# Patient Record
Sex: Female | Born: 1945
Health system: Southern US, Community
[De-identification: ages and names within clinical notes are randomized; demographics above are authoritative.]

## PROBLEM LIST (undated history)

## (undated) DIAGNOSIS — E785 Hyperlipidemia, unspecified: Secondary | ICD-10-CM

## (undated) DIAGNOSIS — H40009 Preglaucoma, unspecified, unspecified eye: Secondary | ICD-10-CM

## (undated) DIAGNOSIS — I10 Essential (primary) hypertension: Secondary | ICD-10-CM

## (undated) DIAGNOSIS — H579 Unspecified disorder of eye and adnexa: Secondary | ICD-10-CM

## (undated) HISTORY — DX: Unspecified disorder of eye and adnexa: H57.9

## (undated) HISTORY — DX: Hyperlipidemia, unspecified: E78.5

## (undated) HISTORY — PX: BUNIONECTOMY: SHX129

## (undated) HISTORY — DX: Preglaucoma, unspecified, unspecified eye: H40.009

## (undated) HISTORY — DX: Essential (primary) hypertension: I10

---

## 1997-11-17 ENCOUNTER — Encounter: Admission: RE | Admit: 1997-11-17 | Discharge: 1997-11-17 | Payer: Self-pay | Admitting: Family Medicine

## 1998-01-27 HISTORY — PX: OTHER SURGICAL HISTORY: SHX169

## 1998-01-27 LAB — HM COLONOSCOPY: HM Colonoscopy: NORMAL

## 1998-08-24 ENCOUNTER — Encounter: Admission: RE | Admit: 1998-08-24 | Discharge: 1998-08-24 | Payer: Self-pay | Admitting: Family Medicine

## 1998-12-05 ENCOUNTER — Encounter: Admission: RE | Admit: 1998-12-05 | Discharge: 1998-12-05 | Payer: Self-pay | Admitting: Family Medicine

## 1999-04-05 ENCOUNTER — Encounter: Admission: RE | Admit: 1999-04-05 | Discharge: 1999-04-05 | Payer: Self-pay | Admitting: Family Medicine

## 1999-04-16 ENCOUNTER — Encounter: Admission: RE | Admit: 1999-04-16 | Discharge: 1999-04-16 | Payer: Self-pay | Admitting: Family Medicine

## 1999-05-15 ENCOUNTER — Encounter: Admission: RE | Admit: 1999-05-15 | Discharge: 1999-05-15 | Payer: Self-pay | Admitting: Family Medicine

## 1999-05-28 ENCOUNTER — Encounter (INDEPENDENT_AMBULATORY_CARE_PROVIDER_SITE_OTHER): Payer: Self-pay | Admitting: *Deleted

## 1999-05-28 LAB — CONVERTED CEMR LAB

## 1999-06-14 ENCOUNTER — Encounter: Admission: RE | Admit: 1999-06-14 | Discharge: 1999-06-14 | Payer: Self-pay | Admitting: Family Medicine

## 1999-06-28 ENCOUNTER — Encounter: Admission: RE | Admit: 1999-06-28 | Discharge: 1999-06-28 | Payer: Self-pay | Admitting: Family Medicine

## 1999-06-28 ENCOUNTER — Encounter: Payer: Self-pay | Admitting: Family Medicine

## 1999-08-01 ENCOUNTER — Ambulatory Visit (HOSPITAL_BASED_OUTPATIENT_CLINIC_OR_DEPARTMENT_OTHER): Admission: RE | Admit: 1999-08-01 | Discharge: 1999-08-01 | Payer: Self-pay | Admitting: Podiatry

## 2000-01-07 ENCOUNTER — Encounter: Admission: RE | Admit: 2000-01-07 | Discharge: 2000-01-07 | Payer: Self-pay | Admitting: Family Medicine

## 2000-11-25 ENCOUNTER — Ambulatory Visit (HOSPITAL_COMMUNITY): Admission: RE | Admit: 2000-11-25 | Discharge: 2000-11-25 | Payer: Self-pay | Admitting: Family Medicine

## 2000-11-25 ENCOUNTER — Encounter: Admission: RE | Admit: 2000-11-25 | Discharge: 2000-11-25 | Payer: Self-pay | Admitting: Family Medicine

## 2000-12-17 ENCOUNTER — Ambulatory Visit (HOSPITAL_BASED_OUTPATIENT_CLINIC_OR_DEPARTMENT_OTHER): Admission: RE | Admit: 2000-12-17 | Discharge: 2000-12-17 | Payer: Self-pay | Admitting: Podiatry

## 2003-01-28 HISTORY — PX: OTHER SURGICAL HISTORY: SHX169

## 2003-09-22 ENCOUNTER — Ambulatory Visit (HOSPITAL_COMMUNITY): Admission: RE | Admit: 2003-09-22 | Discharge: 2003-09-23 | Payer: Self-pay | Admitting: Ophthalmology

## 2004-03-08 ENCOUNTER — Ambulatory Visit: Payer: Self-pay | Admitting: Sports Medicine

## 2006-03-26 DIAGNOSIS — I10 Essential (primary) hypertension: Secondary | ICD-10-CM | POA: Insufficient documentation

## 2006-03-26 DIAGNOSIS — D259 Leiomyoma of uterus, unspecified: Secondary | ICD-10-CM | POA: Insufficient documentation

## 2006-03-26 DIAGNOSIS — E785 Hyperlipidemia, unspecified: Secondary | ICD-10-CM | POA: Insufficient documentation

## 2006-03-26 DIAGNOSIS — K219 Gastro-esophageal reflux disease without esophagitis: Secondary | ICD-10-CM

## 2006-03-26 DIAGNOSIS — N951 Menopausal and female climacteric states: Secondary | ICD-10-CM | POA: Insufficient documentation

## 2006-03-27 ENCOUNTER — Encounter (INDEPENDENT_AMBULATORY_CARE_PROVIDER_SITE_OTHER): Payer: Self-pay | Admitting: *Deleted

## 2006-09-08 ENCOUNTER — Emergency Department (HOSPITAL_COMMUNITY): Admission: EM | Admit: 2006-09-08 | Discharge: 2006-09-08 | Payer: Self-pay | Admitting: Emergency Medicine

## 2006-09-15 ENCOUNTER — Ambulatory Visit: Payer: Self-pay | Admitting: Family Medicine

## 2006-09-15 DIAGNOSIS — R1011 Right upper quadrant pain: Secondary | ICD-10-CM

## 2006-09-16 ENCOUNTER — Ambulatory Visit (HOSPITAL_COMMUNITY): Admission: RE | Admit: 2006-09-16 | Discharge: 2006-09-16 | Payer: Self-pay | Admitting: Family Medicine

## 2009-02-07 IMAGING — US US ABDOMEN COMPLETE
1 series · 13 of 25 positions shown · non-contrast
Comparison: Chest radiograph of 09/08/06.  No prior abdominal imaging for comparison.

CLINICAL DATA: Right upper quadrant pain.  
ABDOMEN ULTRASOUND:
TECHNIQUE: Complete abdominal ultrasound examination was performed including evaluation of the liver, gallbladder, bile ducts, pancreas, kidneys, spleen, IVC, and abdominal aorta.

[Series 1: us abdomen complete · 0.33mm/px · 13 of 61 slices shown]
[im 1/61]
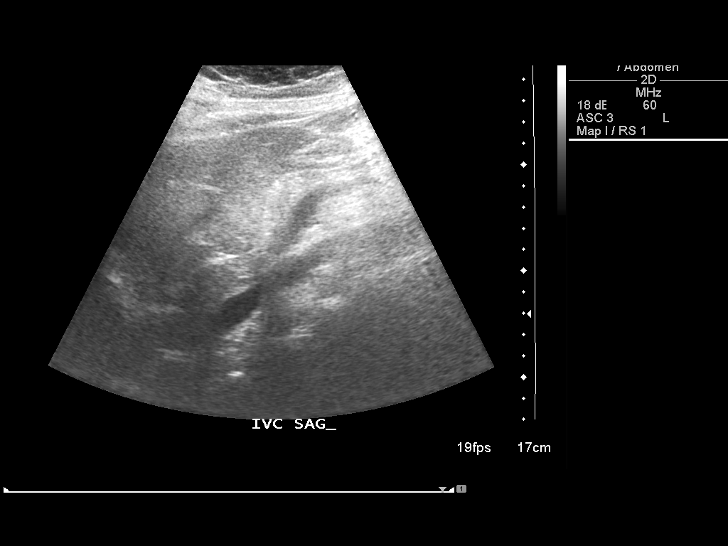
[im 6/61]
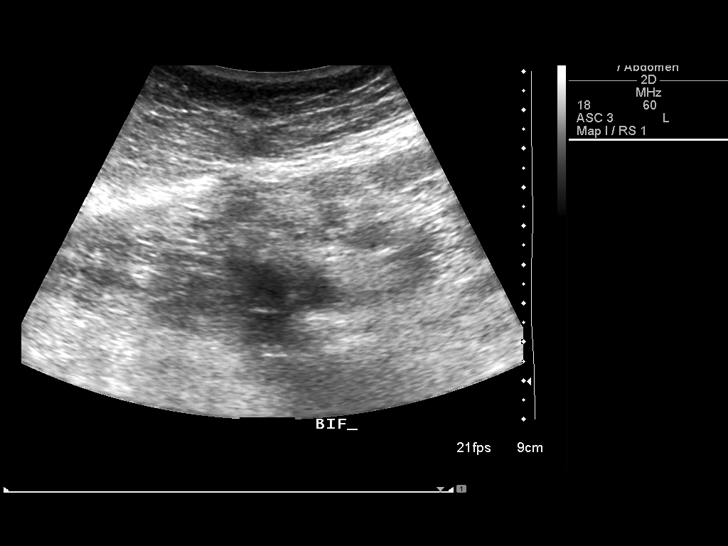
[im 11/61]
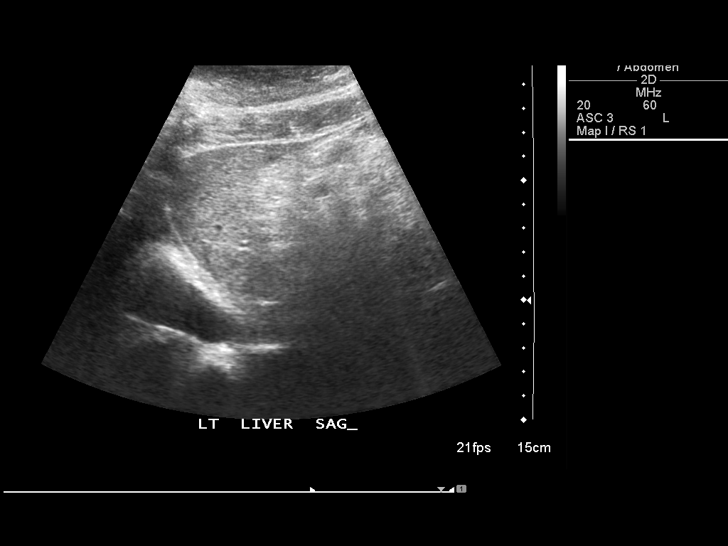
[im 16/61]
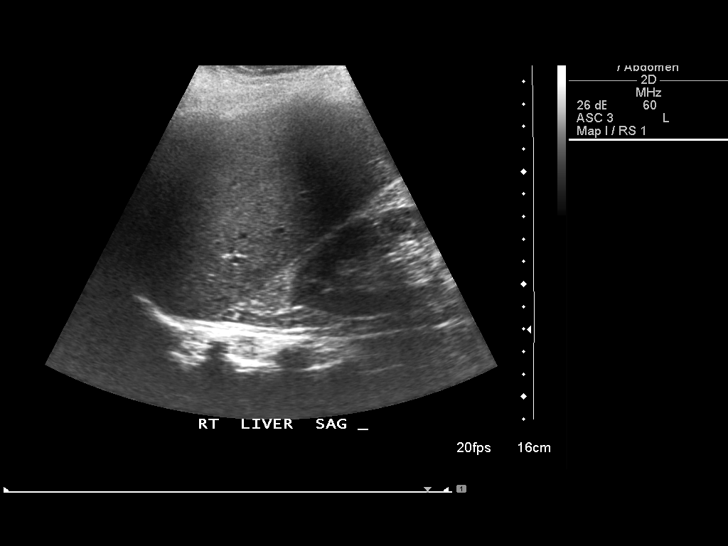
[im 21/61]
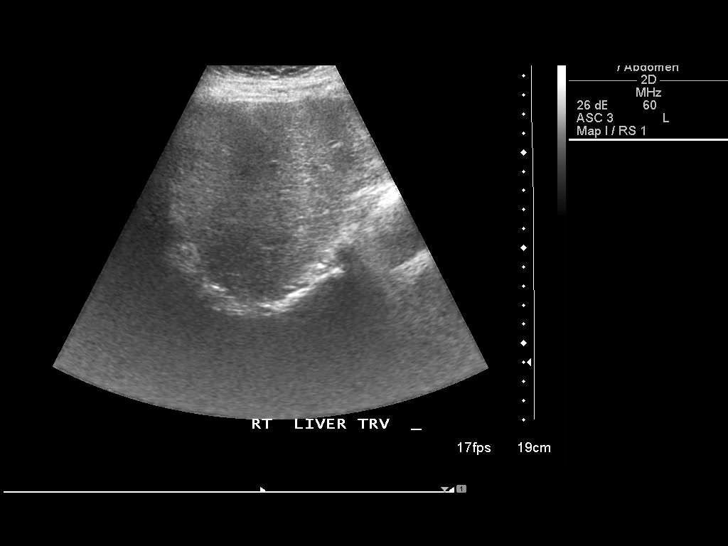
[im 26/61]
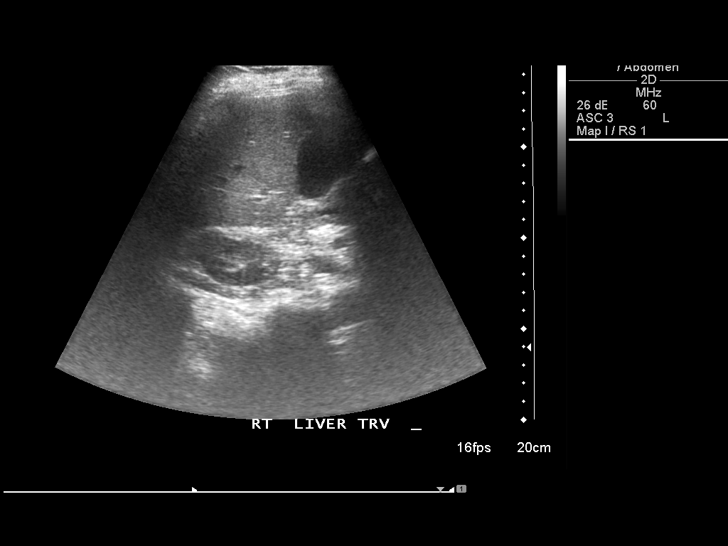
[im 31/61]
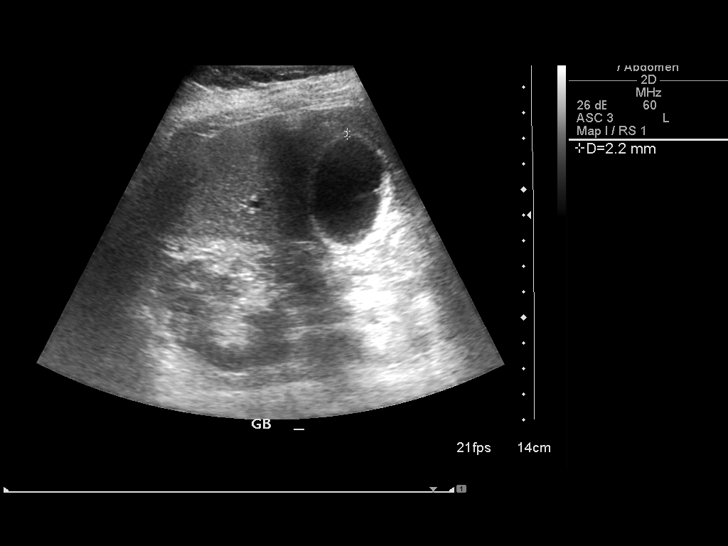
[im 36/61]
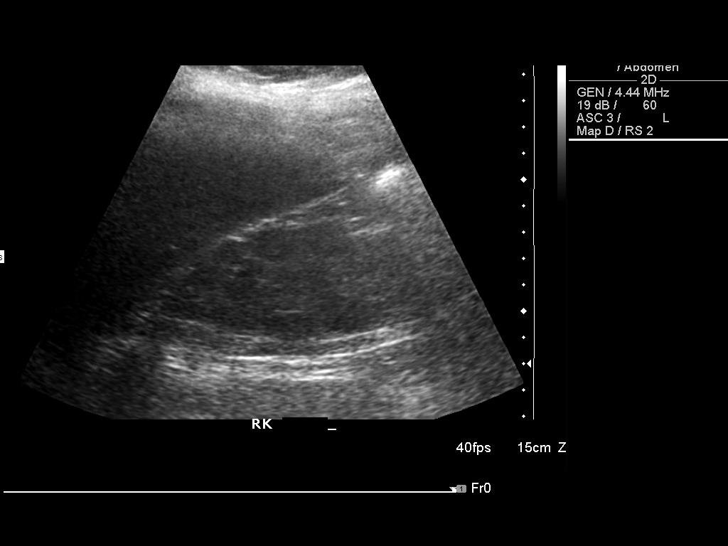
[im 41/61]
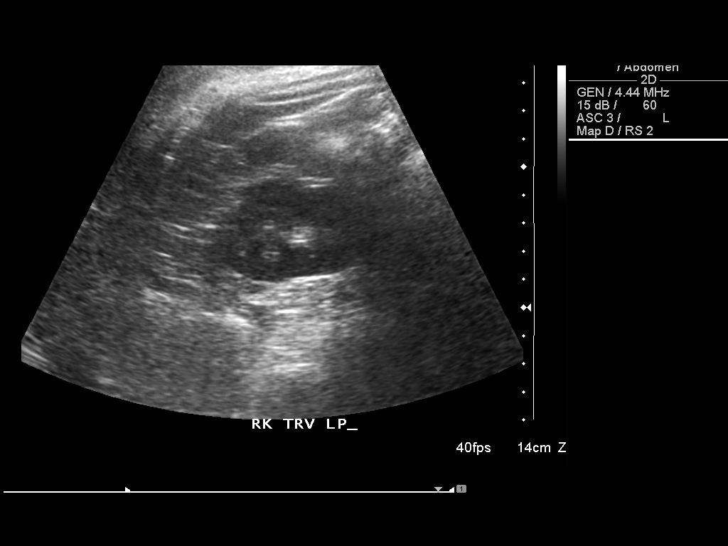
[im 46/61]
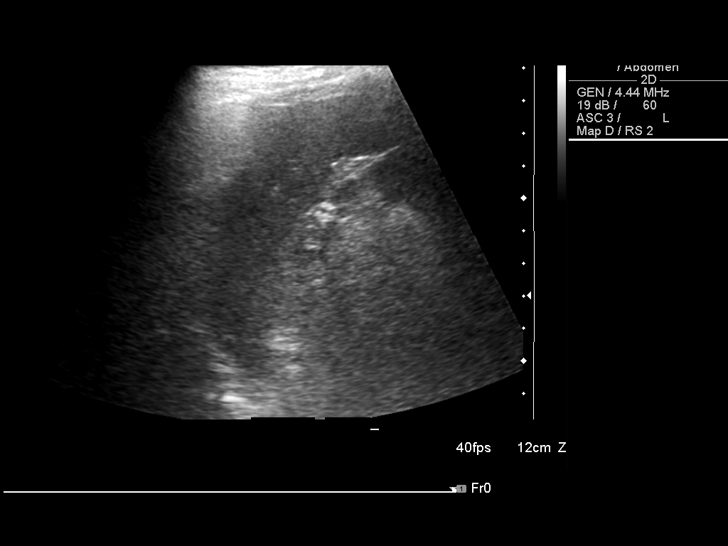
[im 51/61]
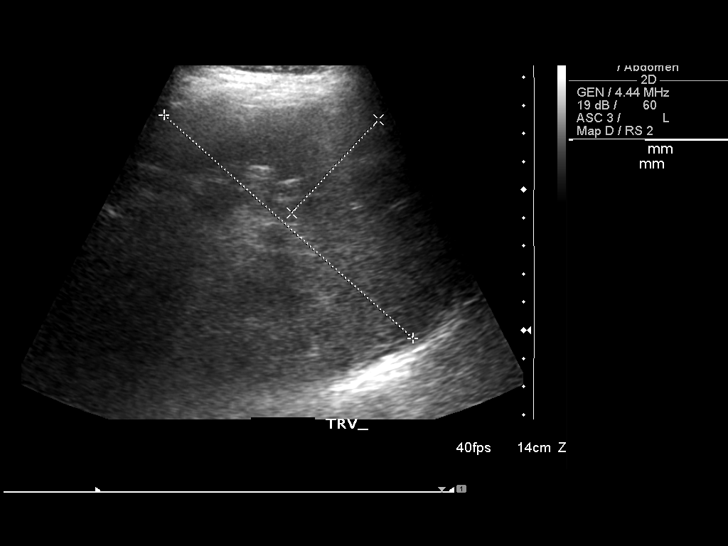
[im 56/61]
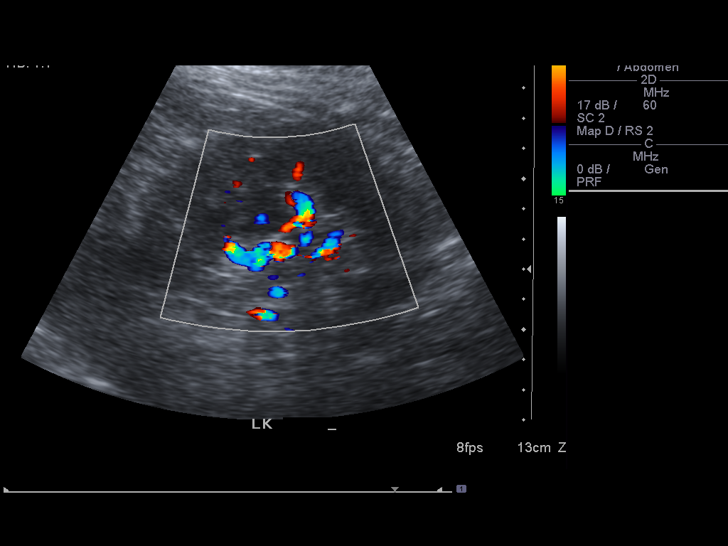
[im 61/61]
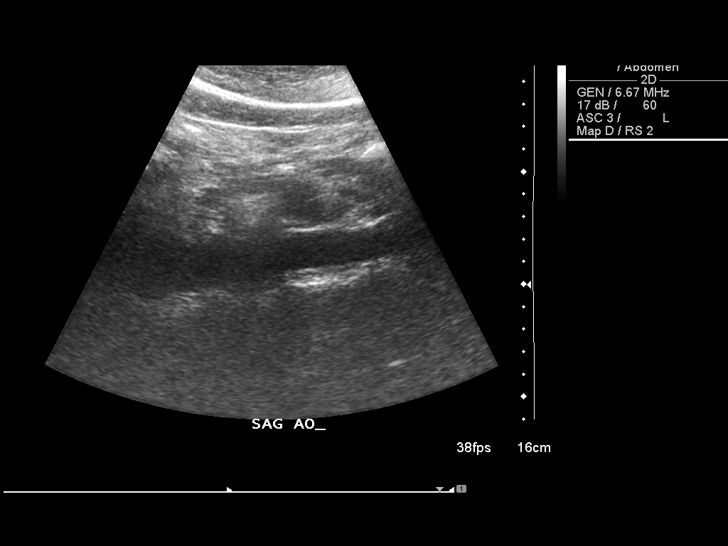

[13 of 25 positions shown; findings below may reference images not displayed]

FINDINGS: A 2 mm non-shadowing echogenic focus is seen along the gallbladder wall and appears immobile.  The remainder of the gallbladder is normal.  The gallbladder wall thickness is normal.  Negative for pericholecystic fluid.  The sonographic Murphy?s sign is negative.  The common bile duct is 5.7 mm in diameter.  The liver is normal in echogenicity without evidence of focal mass or biliary dilatation. 
The visualized portions of the pancreas are unremarkable.  The pancreatic tail is obscured by bowel gas.
The spleen is normal in size and echotexture.  
The kidneys are normal in size measuring 12.3 cm on the right and 11.0 cm on the left.  Negative for hydronephrosis.  
The maximal caliber of the abdominal aorta is 2.6 cm in AP dimension.
IMPRESSION: 1. 2 mm non-shadowing focus along the gallbladder wall is favored to represent a small cholesterol polyp or less likely an adherent gallstone.  Otherwise, the gallbladder is negative, and the patient?s right upper quadrant pain is not felt to be related to this small gallbladder polyp versus adherent stone.
2. Otherwise unremarkable abdominal ultrasound.

## 2010-06-14 NOTE — Op Note (Signed)
[place]. [facility]  Patient:    Jade Gross, BREIT Visit Number: [id number] MRN: [id number]          Service Type: DSU Location: [facility] Attending Physician:  Severiano Gilbert Dictated by:   Eugenio Hoes Petrinitz, D.P.M. Proc. Date: 12/17/00 Admit Date:  12/17/2000                             Operative Report  PREOPERATIVE DIAGNOSIS:  Hallux abductovalgus deformity, left foot.  POSTOPERATIVE DIAGNOSIS:  Hallux abductovalgus deformity, left foot.  PROCEDURE PERFORMED:  Austin bunionectomy with K-wire pin fixation.  ANESTHESIA:  IV sedation with local.  HEMOSTASIS:  By ankle tourniquet and Bovie.  Minimal blood loss.  DESCRIPTION OF PROCEDURE:  The patient was brought to the OR and placed on the table in the supine position.  The ankle tourniquet was placed.  Superior malleoli left foot with sufficient padding to prevent contusion.  Upon achieving of sedation, local anesthesia was administered.  The left foot was prepped and draped in the usual aseptic manner.  The left foot was exsanguinated with an Esmarch bandage, ankle tourniquet inflated to 250 mmHg. The following procedure was performed.  Austin bunionectomy left foot, K-wire pin fixation.  Attention was directed to the dorsum of the left foot.  A longitudinal incision was planned with marking pen.  This was centered between extensor hallucis longus tendon and medial aspect of the foot.  The skin incision was executed with a 15-blade and sharp and blunt tissue dissection was performed through the superficial and deep fascial layers.  All venous bleeders were clamped and bovied as encountered.  The capsule overlying the first MPJ was identified and then incised in a longitudinal fashion.  The capsule and periosteum were reflected free with the [facility]. Further sharp dissection was performed on the medial side exposing the medial prominence.  The joint space was inspected and there  was found to be good cartilaginous surface overlying the metatarsal head and base of the proximal phalanx with no defects or signs of osteoarthritis.  Attention was then directed to the interspace.  Sharp and blunt tissue dissection was performed down to the level of the adductor tendon at the level of the base of the proximal phalanx.  This was then excised in a coaxial fashion.  An incision was performed superior to the fibular sesamoid and all other attachments were left intact.  There was good mobilization of the lateral side.  The interspace was flushed with copious amounts of antibiotic solution.  Attention was redirected to the medial side and the capsule was reflected with the malleable retractor.  The osseous prominence was then resected in a longitudinal fashion with the sagittal saw with great care to preserve the sagittal groove. The V-shaped Austin osteotomy was then planned on the flattened medial surface with apex distal and wings exiting proximally.  This was then executed from mediolateral with the sagittal saw.  The metatarsal head was then transposed laterally in a more correct anatomical position approximately one-third with great care not to put the toe in varus.  The metatarsal head was then impacted.  The forefoot was loaded and there was found to be good rectus position, good alignment of the phalangeal base of the metatarsal head with no risk of varus.  This was then checked with the [facility] mobile x-ray unit and confirmed that there was good alignment with no risk of varus.  The  surgical site was flushed with copious amounts of antibiotic solution and the remaining shelf medial on the proximal side was resected with the sagittal saw.  All rough edges were rasped smooth with a football bur, followed by reciprocating rasp.  Once again, additional antibiotic flush was performed and the site was found to be free of bony spicules.  The joint capsule and periosteum were  then reapproximated with 3-0 Monocryl.  The deep and superficial fascial layers were reapproximated with 4-0 Monocryl and the skin reapproximated with 5-0 Monocryl in a subcuticular fashion.  The site was then injected with 1 cc of Dexamethasone phosphate, 4 mg/mL.  Area was prepped with benzoin, followed by application of Steri-Strips, prepped with Betadine and Xeroform with a dry sterile dressing was applied.  This was further reinforced with Coflex compression and Ace bandage.  The ankle tourniquet was deflated and capillary filling time was noted to return instantaneous in all digits.  The patient tolerated the procedure and anesthesia well and was transferred to recovery room.  All vital signs stable.   Dispensed detailed postoperative instructions, prescription and return to office visit in 4-7 days.  Advise the patient to contact myself or emergency pager service if questions or problems arise. Dictated by:   Eugenio Hoes Petrinitz, D.P.M. Attending Physician:  Severiano Gilbert DD:  12/17/00 TD:  12/17/00 Job: [id number]

## 2010-06-14 NOTE — Op Note (Signed)
NAME:  Jade Gross, Jade Gross                            ACCOUNT NO.:  [id number]   MEDICAL RECORD NO.:  [id number]                   PATIENT TYPE:  OIB   LOCATION:  5742                                 FACILITY:  [facility]   PHYSICIAN:  Beulah Gandy. Ashley Royalty, M.D.              DATE OF BIRTH:  17-Mar-1945   DATE OF PROCEDURE:  09/22/2003  DATE OF DISCHARGE:  09/23/2003                                 OPERATIVE REPORT   ADMISSION DIAGNOSIS:  Rhegmatogenous retinal detachment left eye.   PROCEDURES:  1. Scleral buccal left eye.  2. Gas injection left eye.  3. Panretinal photocoagulation left eye.   SURGEON:  Alan Mulder, M.D.   ASSISTANT:  Alma Downs, P.A.   ANESTHESIA:  General.   DETAILS:  Usual prep and drape, 360 degree limbal peritomy, isolation of  four rectus muscles on 2-0 silk.  Localization of breaks at 8 and 10  o'clock.  Scleral dissection for 360 degrees to admit a #279 intrascleral  implant.  Diathermy placed in the bed.  The 279 implant was placed with the  joint at 2 o'clock.  A 240 band placed around the eye with a 270 sleeve at  10 o'clock.  Perforation site shows it at 9 o'clock in the posterior aspect  of the bed.  A large amount of clear, colorless, subretinal fluid came forth  in a controlled slow manner.  The scleral buccal was closed and a 508G  radial segment was placed beneath the 8 o'clock break.  The scleral flaps  were closed and the proper indentation of the globe was performed.  The band  was adjusted and trimmed, the buccal was adjusted and trimmed.  Indirect  ophthalmoscopy showed the retina to be lying nicely on the scleral buccal  for 360 degrees. The indirect ophthalmoscope laser was moved into place.  There were 1117 burns placed around the retinal periphery with a power  between 400 and 700 milliwatts, 1000 microns each and 0.07 seconds each.  These scleral sutures were drain securely, knotted and the free ends  removed.  The conjunctiva was reposited  with 7-0 chromic suture.  C3F8 100%,  0.2 mL, was injected to the 10 o'clock pars plana.  The conjunctiva was  reposited with 7-0 chromic suture.  Polymyxin and gentamicin were irrigated  into Tenon's space.  Atropine solution was applied.  Marcaine was injected  around the globe for postop pain.  Decadron 10 mg was injected into the  lower subconjunctival space.  Polysporin, a patch and shield were placed.  Closing tension was obtained with a Barraquer tonometer.   COMPLICATIONS:  None.   DURATION:  Two hours.   Polysporin, a patch and shield were placed.  The patient was awakened and  taken to recovery in satisfactory condition.  Beulah Gandy. Ashley Royalty, M.D.   JDM/MEDQ  D:  09/22/2003  T:  09/24/2003  Job:  [id number]

## 2010-06-14 NOTE — Op Note (Signed)
[place]  Patient:    Jade Gross, Jade Gross                         MRN: [id number] Proc. Date: 08/01/99 Adm. Date:  [id number] Attending:  Geradine Girt A                           Operative Report  SURGEON:  Eugenio Hoes. Petrinitz, D.P.M.  CO-SURGEON:  Arbutus Ped, M.D.  PREOPERATIVE DIAGNOSIS:  Hallux rigidus, first metatarsophalangeal joint right foot.  POSTOPERATIVE DIAGNOSIS:  Hallux rigidus, first metatarsophalangeal joint right foot.  PROCEDURE PERFORMED:  Lorenz Coaster bunionectomy right foot, with Bio-Action great toe implant.  ANESTHESIA:  IV sedation with local.  HEMOSTASIS:  Via ankle tourniquet.  PREOPERATIVE PROPHYLAXIS:  With antibiotics 1 g Ancef IV piggyback prior to inflation of tourniquet.  DESCRIPTION OF PROCEDURE:  The patient was brought to the OR and placed on table in a supine position.  An ankle tourniquet was placed superior to malleoli, right foot, with sufficient padding to prevent contusion.  Upon achieving of sedation, the right foot was injected with local anesthetic in a typical Mayo block.  The right foot was prepped and draped in the usual aseptic manner.  Right foot exsanguinated with an Esmarch bandage, ankle tourniquet inflated to 250 mmHg.  Following procedure performed:  Keller bunionectomy, right foot, with total first MPJ Bio-Action joint implant. Attention was drawn to the dorsum of the right foot.  A longitudinal skin incision was planned and performed overlying the first MPJ in a longitudinal fashion.  This was centered between the extensor hallucis longus tendon and the medial aspect of the foot.  Further sharp and blunt tissue dissection was performed.  Venous bleeders were clamped and bovied as encountered.  Further dissection was carried down to the level of the joint capsule overlying the first MPJ.  The joint capsule was incised in a longitudinal fashion.  The periosteum was reflected with a Building control surveyor.  Further sharp dissection was performed and all soft tissue was dissected free from the head of the first metatarsal and base of the proximal phalanx.  Attention was then directed to the metatarsal head, and bone cot in the metatarsal head was planned.  This was perpendicular to the long axis of the metatarsal in the sagittal and transverse planes.  Approximately 2 mm of bone was excised.  This was executed with the sagittal saw.  Attention was then directed to the base of the proximal phalanx.  At this time, the base of the proximal phalanx was excised with the sagittal saw.  This was performed perpendicular to the long axis of the hallux.  The base of the proximal phalanx was sharply dissected free.  The metatarsal component and phalangeal component were held together and measured, and this was approximately 1.2 cm of bone in width.  Attention was then directed to the head of the proximal phalanx.  Copious antibiotic lavage was performed utilizing the lollipop.  The central canal was then reamed with the T-shaped awl.  The metatarsal medullary canal was then drilled with an egg-shaped bur.  Utilizing the proper reaming metatarsal component, the metatarsal canal was prepared for the small metatarsal implant.  After this was complete, the site was flushed with copious amounts of antibiotic solution, followed by placement of the small neutral metatarsal head sizer. This fit very well.  Attention was then directed to  the phalangeal base.  The appropriate broach was utilized, followed by the central placement with the phalangeal lollipop.  The broach was utilized to prepare the canal, and then the appropriate sizer was implanted.  The site was flushed with copious amounts of antibiotic solution.  An [facility] radiograph was then utilized, and there was found to be good placement in both the dorsal plantar and lateral views.  The appropriate implants were then requested, and these  were placed in antibiotic lavage solution.  The sizers were removed, the site was flushed with copious amounts of antibiotic solution, and the appropriate implants were inserted.  Prior to removal of the sizers, any of the overhanging bone was resected with the sagittal saw, and all rough edges were rasped smooth.  After complete placement of the appropriate implants, the surgical site was flushed with copious amounts of antibiotic solution.  The placement of the implants was then inspected with the [facility] mobile unit, and the radiographs revealed that there was excellent position in both the AP and lateral projections.  Further antibiotic flush was performed, followed by joint capsule closure with 3-0 Monocryl running suture.  Deep and superficial fascial layers were approximated with 4-0 Monocryl followed by a subcuticular 5-0 Monocryl for the skin closure.  The site was injected with 1 cc of dexamethasone phosphate 4 mg/ml, and the skin was prepped with benzoin, followed by application of Steri-Strips.  Next, I utilized Xeroform and a dry sterile dressing.  The ankle tourniquet was deflated, and capillary filling time was noted to return to instantaneous in all digits.  The patient tolerated the procedure and anesthesia well, transported to recovery room, all vital signs stable. Capillary filling time was noted to be instantaneous in all digits.  The patient was advised regarding postoperative instructions and requested to call myself or emergency beeper service if questions or problems arise. DD:  08/01/99 TD:  08/01/99 Job: [id number]

## 2011-02-05 ENCOUNTER — Encounter: Payer: Self-pay | Admitting: Family

## 2011-02-05 ENCOUNTER — Ambulatory Visit (INDEPENDENT_AMBULATORY_CARE_PROVIDER_SITE_OTHER): Payer: Self-pay | Admitting: Family

## 2011-02-05 VITALS — BP 148/92 | HR 90 | Temp 98.2°F | Resp 16 | Ht 62.0 in | Wt 172.1 lb

## 2011-02-05 DIAGNOSIS — Z0000 Encounter for general adult medical examination without abnormal findings: Secondary | ICD-10-CM

## 2011-02-05 DIAGNOSIS — E785 Hyperlipidemia, unspecified: Secondary | ICD-10-CM

## 2011-02-05 DIAGNOSIS — R21 Rash and other nonspecific skin eruption: Secondary | ICD-10-CM

## 2011-02-05 DIAGNOSIS — I10 Essential (primary) hypertension: Secondary | ICD-10-CM

## 2011-02-05 DIAGNOSIS — K219 Gastro-esophageal reflux disease without esophagitis: Secondary | ICD-10-CM

## 2011-02-05 LAB — CBC WITH DIFFERENTIAL/PLATELET
Basophils Relative: 1 % (ref 0–1)
Eosinophils Absolute: 0.1 10*3/uL (ref 0.0–0.7)
Eosinophils Relative: 1 % (ref 0–5)
HCT: 45.6 % (ref 36.0–46.0)
Hemoglobin: 14.4 g/dL (ref 12.0–15.0)
Lymphs Abs: 1.4 10*3/uL (ref 0.7–4.0)
MCH: 31 pg (ref 26.0–34.0)
MCHC: 31.6 g/dL (ref 30.0–36.0)
MCV: 98.1 fL (ref 78.0–100.0)
Monocytes Absolute: 0.5 10*3/uL (ref 0.1–1.0)
Monocytes Relative: 8 % (ref 3–12)

## 2011-02-05 LAB — HEPATIC FUNCTION PANEL
ALT: 11 U/L (ref 0–35)
AST: 13 U/L (ref 0–37)
Albumin: 4.4 g/dL (ref 3.5–5.2)
Alkaline Phosphatase: 93 U/L (ref 39–117)

## 2011-02-05 LAB — BASIC METABOLIC PANEL
CO2: 24 mEq/L (ref 19–32)
Chloride: 106 mEq/L (ref 96–112)
Creat: 0.95 mg/dL (ref 0.50–1.10)
Potassium: 4.4 mEq/L (ref 3.5–5.3)
Sodium: 142 mEq/L (ref 135–145)

## 2011-02-05 LAB — TSH: TSH: 0.529 u[IU]/mL (ref 0.350–4.500)

## 2011-02-05 LAB — LIPID PANEL: Cholesterol: 238 mg/dL — ABNORMAL HIGH (ref 0–200)

## 2011-02-05 MED ORDER — HYDROCHLOROTHIAZIDE 25 MG PO TABS: 25.0000 mg | ORAL_TABLET | Freq: Every day | ORAL | Status: DC

## 2011-02-05 MED ORDER — OMEPRAZOLE 20 MG PO CPDR: 20.0000 mg | DELAYED_RELEASE_CAPSULE | Freq: Every day | ORAL | Status: DC

## 2011-02-05 MED ORDER — CLOTRIMAZOLE-BETAMETHASONE 1-0.05 % EX CREA: TOPICAL_CREAM | Freq: Two times a day (BID) | CUTANEOUS | Status: DC

## 2011-02-05 NOTE — Progress Notes (Signed)
Subjective:    Patient ID: Jade Gross, female    DOB: 07-28-1945, 66 y.o.   MRN: [id number]  HPI  Ms.  Gross is a 66 yr old female who presents today to establish care. 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  She comes today to discuss rash.  Rash started 1 month ago.  She reports associated itching.  She denies associated pain or blisters. She has tried neosporin which helps to stop the itching.  She denies previous hx of rashes.    Past medical history is significant for the following:  HTN- She reports that she has been on HCTZ in the past.  She reports that she checks her BP occasionally- had one reading of  136/78, but generally BP has been running higher.   GERD- uses zantac otc, reports that her reflux is not optimally controlled.   Hyperlipidemia- She has never been on medication for this.  She has cut back on red meat and fried food.    R eye- cataract removal at age 77.  L eye cataract surgery at 23- she had an infection for 6 months.  She had a retinal tear 2005.  She sees Dr. Annice Pih and Dr. Mariane Baumgarten. She has borderline glaucoma.     Review of Systems  Constitutional: Negative for unexpected weight change.  HENT: Negative for congestion.   Eyes: Negative for visual disturbance.  Respiratory: Negative for cough and shortness of breath.   Cardiovascular: Negative for chest pain.  Gastrointestinal: Negative for nausea, vomiting and diarrhea.  Genitourinary: Negative for dysuria, hematuria and enuresis.  Musculoskeletal: Negative for myalgias.       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  Neurological: Negative for headaches.  Hematological: Negative for adenopathy.  Psychiatric/Behavioral:       Denies depression/anxiety   Past Medical History  Diagnosis Date  . Hypertension   . Hyperlipidemia   . Borderline glaucoma     History   Social History  . Marital Status: Divorced    Spouse Name: N/A    Number of Children: 1  . Years  of Education: N/A   Occupational History  .  [employer]   Social History Main Topics  . Smoking status: Never Smoker   . Smokeless tobacco: Never Used  . Alcohol Use: No  . Drug Use: No  . Sexually Active: Not on file   Other Topics Concern  . Not on file   Social History Narrative   Caffeine use:  <1 dailyRegular exercise:  NoNever smokedWork at [employer] as a Merchandiser, retail in accountingDivorcedShe lives with roommateCompleted 12th grade    Past Surgical History  Procedure Date  . Cataract surgery, lens implant and retinal repain 2005    left eye  . Cataract surger/lens implant 2000    right eye    Family History  Problem Relation Age of Onset  . Heart disease Mother     Pacemaker at age 22  . Hypertension Mother   . Heart disease Father     died at 58 from MI, he had first heart attack at 97  . Stroke Maternal Aunt   . Cancer Neg Hx   . Heart disease      died at 30 from heart attack (Brother's son)    No Known Allergies  No current outpatient prescriptions on file prior to visit.    BP 148/92  Pulse 90  Temp(Src) 98.2 F (36.8 C) (Oral)  Resp  16  Ht 5\' 2"  (1.575 m)  Wt 172 lb 1.9 oz (78.073 kg)  BMI 31.48 kg/m2  LMP 02/05/1996       Objective:   Physical Exam  Constitutional: She appears well-developed and well-nourished. No distress.  HENT:  Head: Normocephalic and atraumatic.  Eyes: Conjunctivae are normal. Pupils are equal, round, and reactive to light.  Neck: Normal range of motion. Neck supple. No thyromegaly present.  Cardiovascular: Normal rate and regular rhythm.   No murmur heard. Pulmonary/Chest: Effort normal and breath sounds normal. No respiratory distress. She has no wheezes. She has no rales. She exhibits no tenderness.  Musculoskeletal: She exhibits no edema.  Lymphadenopathy:    She has no cervical adenopathy.  Skin: Skin is warm and dry.       Raised, erythematous rash at top of posterior neck extending under hairline.    Psychiatric: She has a normal mood and affect. Her behavior is normal. Judgment and thought content normal.          Assessment & Plan:

## 2011-02-05 NOTE — Patient Instructions (Signed)
Please follow up in 1 month for a complete physical, come fasting to this appointment.

## 2011-02-06 ENCOUNTER — Encounter: Payer: Self-pay | Admitting: Family

## 2011-02-07 DIAGNOSIS — R21 Rash and other nonspecific skin eruption: Secondary | ICD-10-CM | POA: Insufficient documentation

## 2011-02-07 NOTE — Assessment & Plan Note (Addendum)
Will give trial of prilosec in place of zantac.

## 2011-02-07 NOTE — Assessment & Plan Note (Addendum)
Deteriorated. Will resume HCTZ.  Obtain BMET.

## 2011-02-07 NOTE — Assessment & Plan Note (Addendum)
Not at goal. Cholesterol 238, LDL 175.  Pt advised to follow a low cholesterol diet. Plan to repeat FLP in 6 mos.

## 2011-02-07 NOTE — Assessment & Plan Note (Addendum)
New, ? Eczema/? psoriasis, ? Fungal component. Trial of lotrisone. If no improvement consider referral to dermatology.

## 2011-03-05 ENCOUNTER — Other Ambulatory Visit (HOSPITAL_COMMUNITY)
Admission: RE | Admit: 2011-03-05 | Discharge: 2011-03-05 | Disposition: A | Discharge status: Home / Self Care | Payer: 59 | Source: Ambulatory Visit | Attending: Family | Admitting: Family

## 2011-03-05 ENCOUNTER — Ambulatory Visit (INDEPENDENT_AMBULATORY_CARE_PROVIDER_SITE_OTHER): Payer: 59 | Admitting: Family

## 2011-03-05 ENCOUNTER — Encounter: Payer: Self-pay | Admitting: Family

## 2011-03-05 DIAGNOSIS — Z01419 Encounter for gynecological examination (general) (routine) without abnormal findings: Secondary | ICD-10-CM

## 2011-03-05 DIAGNOSIS — D259 Leiomyoma of uterus, unspecified: Secondary | ICD-10-CM

## 2011-03-05 DIAGNOSIS — Z0000 Encounter for general adult medical examination without abnormal findings: Secondary | ICD-10-CM | POA: Insufficient documentation

## 2011-03-05 DIAGNOSIS — Z23 Encounter for immunization: Secondary | ICD-10-CM

## 2011-03-05 DIAGNOSIS — Z136 Encounter for screening for cardiovascular disorders: Secondary | ICD-10-CM

## 2011-03-05 NOTE — Assessment & Plan Note (Signed)
We discussed the importance of regular exercise and healthy diet.  Pap smear performed today.  Labs completed last visit. Tdap/pneumovax today. We discussed zostavax and she will consider and let us know if she would like to proceed. Declines colo but agreeable to ifob.  Mammogram and dexa ordered today. 

## 2011-03-05 NOTE — Assessment & Plan Note (Deleted)
We discussed the importance of regular exercise and healthy diet.  Pap smear performed today.  Labs completed last visit. Tdap/pneumovax today. We discussed zostavax and she will consider and let us know if she would like to proceed. Declines colo but agreeable to ifob.  Mammogram and dexa ordered today.

## 2011-03-05 NOTE — Patient Instructions (Signed)
Please schedule bone density at front desk. Schedule mammogram on first floor in radiology. Complete stool kit and mail back.  Follow up in 6 months.

## 2011-03-05 NOTE — Progress Notes (Signed)
Subjective:    Patient ID: Jade Gross, female    DOB: Jul 04, 1945, 66 y.o.   MRN: [id number]  HPI  Preventative- declines colo.  Diet is fair, stress eater.  Her mother was in a rehab center.  Due for tdap and pneumovax. Walks some, no major exercise.  Reports that she does better in the summer than the winter.    Rash in hair is much better, lotrisone helped.    GERD- improved by prilosec.   Review of Systems  Constitutional: Negative for unexpected weight change.  HENT: Negative for congestion and ear discharge.   Eyes: Negative for visual disturbance.  Respiratory: Negative for cough.   Cardiovascular: Negative for chest pain and leg swelling.  Gastrointestinal: Negative for nausea, vomiting and diarrhea.  Genitourinary: Negative for urgency and frequency.  Musculoskeletal: Negative for myalgias.       Left hand arthritis, mild  Skin: Negative for rash.  Neurological: Negative for headaches.  Hematological: Negative for adenopathy.  Psychiatric/Behavioral:       Denies depression/anxiety   Past Medical History  Diagnosis Date  . Hypertension   . Hyperlipidemia   . Borderline glaucoma     History   Social History  . Marital Status: Divorced    Spouse Name: N/A    Number of Children: 1  . Years of Education: N/A   Occupational History  .  [employer]   Social History Main Topics  . Smoking status: Never Smoker   . Smokeless tobacco: Never Used  . Alcohol Use: No  . Drug Use: No  . Sexually Active: Not on file   Other Topics Concern  . Not on file   Social History Narrative   Caffeine use:  <1 dailyRegular exercise:  NoNever smokedWork at [employer] as a Merchandiser, retail in accountingDivorcedShe lives with roommateCompleted 12th grade    Past Surgical History  Procedure Date  . Cataract surgery, lens implant and retinal repain 2005    left eye  . Cataract surger/lens implant 2000    right eye    Family History  Problem Relation Age of Onset  . Heart disease  Mother     Pacemaker at age 3  . Hypertension Mother   . Heart disease Father     died at 67 from MI, he had first heart attack at 27  . Stroke Maternal Aunt   . Cancer Neg Hx   . Heart disease      died at 54 from heart attack (Brother's son)    No Known Allergies  Current Outpatient Prescriptions on File Prior to Visit  Medication Sig Dispense Refill  . clotrimazole-betamethasone (LOTRISONE) cream Apply topically 2 (two) times daily.  30 g  1  . hydrochlorothiazide (HYDRODIURIL) 25 MG tablet Take 1 tablet (25 mg total) by mouth daily.  30 tablet  2  . omeprazole (PRILOSEC) 20 MG capsule Take 1 capsule (20 mg total) by mouth daily.  30 capsule  5  . timolol (BETIMOL) 0.25 % ophthalmic solution Place 1 drop into both eyes daily.        BP 134/82  Pulse 76  Temp(Src) 98.1 F (36.7 C) (Oral)  Resp 16  Wt 174 lb (78.926 kg)  SpO2 93%  LMP 02/05/1996       Objective:   Physical Exam  Physical Exam  Constitutional: She is oriented to person, place, and time. She appears well-developed and well-nourished. No distress.  HENT:  Head: Normocephalic and atraumatic.  Right Ear: Tympanic membrane  and ear canal normal.  Left Ear: Tympanic membrane and ear canal normal.  Mouth/Throat: Oropharynx is clear and moist.  Eyes: Pupils are equal, round, and reactive to light. No scleral icterus.  Neck: Normal range of motion. No thyromegaly present.  Cardiovascular: Normal rate and regular rhythm.   No murmur heard. Pulmonary/Chest: Effort normal and breath sounds normal. No respiratory distress. He has no wheezes. She has no rales. She exhibits no tenderness.  Abdominal: Soft. Bowel sounds are normal. He exhibits no distension and no mass. There is no tenderness. There is no rebound and no guarding.  Musculoskeletal: She exhibits no edema.  Lymphadenopathy:    She has no cervical adenopathy.  Neurological: She is alert and oriented to person, place, and time.  She exhibits normal  muscle tone. Coordination normal.  Skin: Skin is warm and dry.  Psychiatric: She has a normal mood and affect. Her behavior is normal. Judgment and thought content normal.  Breasts: Examined lying Right: Without masses, retractions, discharge or axillary adenopathy.  Left: Without masses, retractions, discharge or axillary adenopathy.  Inguinal/mons: Normal without inguinal adenopathy  External genitalia: Normal  BUS/Urethra/Skene's glands: Normal  Bladder: Normal  Vagina: Normal  Cervix: Normal  Uterus: normal in size, shape and contour. Midline and mobile  Adnexa/parametria:  Rt: Without masses or tenderness.  Lt: Without masses or tenderness.  Anus and perineum: Normal           Assessment & Plan:            Assessment & Plan:         Assessment & Plan:

## 2011-03-06 ENCOUNTER — Encounter: Payer: Self-pay | Admitting: Family

## 2011-03-24 ENCOUNTER — Other Ambulatory Visit: Payer: 59

## 2011-03-24 ENCOUNTER — Encounter: Payer: Self-pay | Admitting: Family

## 2011-03-24 LAB — FECAL OCCULT BLOOD, IMMUNOCHEMICAL: Fecal Occult Bld: NEGATIVE

## 2011-04-02 ENCOUNTER — Ambulatory Visit (INDEPENDENT_AMBULATORY_CARE_PROVIDER_SITE_OTHER)
Admission: RE | Admit: 2011-04-02 | Discharge: 2011-04-02 | Disposition: A | Discharge status: Home / Self Care | Payer: 59 | Source: Ambulatory Visit

## 2011-04-02 ENCOUNTER — Ambulatory Visit (HOSPITAL_BASED_OUTPATIENT_CLINIC_OR_DEPARTMENT_OTHER)
Admission: RE | Admit: 2011-04-02 | Discharge: 2011-04-02 | Disposition: A | Discharge status: Home / Self Care | Payer: 59 | Source: Ambulatory Visit | Attending: Family | Admitting: Family

## 2011-04-02 DIAGNOSIS — Z1231 Encounter for screening mammogram for malignant neoplasm of breast: Secondary | ICD-10-CM | POA: Insufficient documentation

## 2011-04-02 DIAGNOSIS — Z0000 Encounter for general adult medical examination without abnormal findings: Secondary | ICD-10-CM

## 2011-04-02 LAB — HM MAMMOGRAPHY: HM Mammogram: NORMAL

## 2011-04-28 ENCOUNTER — Encounter: Payer: Self-pay | Admitting: Family

## 2011-04-29 ENCOUNTER — Telehealth: Payer: Self-pay | Admitting: Family

## 2011-04-29 DIAGNOSIS — M8580 Other specified disorders of bone density and structure, unspecified site: Secondary | ICD-10-CM | POA: Insufficient documentation

## 2011-04-29 NOTE — Telephone Encounter (Signed)
Please call patient and let her know that her bone density test shows mild bone thinning.  I would like for her to add caltrate 600mg  +D bid, make sure she gets regular weight bearing exercise (walking etc.) I would also like her to return to lab for vitamin D level.  She should plan to repeat dexa in 2 yrs.

## 2011-04-29 NOTE — Telephone Encounter (Signed)
Left message on machine to return my call. 

## 2011-05-07 NOTE — Telephone Encounter (Signed)
Left message on home machine to return my call.

## 2011-05-08 ENCOUNTER — Other Ambulatory Visit: Payer: Self-pay | Admitting: Family

## 2011-05-08 NOTE — Telephone Encounter (Signed)
Rx refill sent to pharmacy. 

## 2011-05-19 NOTE — Telephone Encounter (Signed)
Unable to reach pt by phone. Mailed letter and lab order to pt.

## 2011-05-28 NOTE — Telephone Encounter (Signed)
Pt presented to the lab, future order released and given to the lab. 

## 2011-05-28 NOTE — Telephone Encounter (Signed)
Addended by: Mervin Kung A on: 05/28/2011 04:13 PM   Modules accepted: Orders

## 2011-05-29 LAB — VITAMIN D 25 HYDROXY (VIT D DEFICIENCY, FRACTURES): Vit D, 25-Hydroxy: 24 ng/mL — ABNORMAL LOW (ref 30–89)

## 2011-05-30 ENCOUNTER — Telehealth: Payer: Self-pay | Admitting: Family

## 2011-05-30 MED ORDER — VITAMIN D (ERGOCALCIFEROL) 1.25 MG (50000 UNIT) PO CAPS: 50000.0000 [IU] | ORAL_CAPSULE | ORAL | Status: DC

## 2011-05-30 NOTE — Telephone Encounter (Signed)
Left message on home # to return my call. 

## 2011-05-30 NOTE — Telephone Encounter (Signed)
pls call pt and let her know that her vitamin d level is low. I would like her to add a vitamin d supplement one weekly and repeat vit d in 12 weeks. rx sent to pharm.

## 2011-06-02 NOTE — Telephone Encounter (Signed)
Left message on home # to return my call. 

## 2011-06-03 NOTE — Telephone Encounter (Signed)
Unable to reach pt by phone. Mailed instructions below to pt and to call if any questions.

## 2011-08-08 ENCOUNTER — Other Ambulatory Visit: Payer: Self-pay | Admitting: Family

## 2011-08-28 ENCOUNTER — Telehealth: Payer: Self-pay | Admitting: *Deleted

## 2011-08-28 DIAGNOSIS — E559 Vitamin D deficiency, unspecified: Secondary | ICD-10-CM

## 2011-08-28 NOTE — Telephone Encounter (Signed)
Pt presented to the lab. Order entered per 05/30/11 phone note and given to the lab.

## 2011-08-29 ENCOUNTER — Telehealth: Payer: Self-pay | Admitting: Family

## 2011-08-29 NOTE — Telephone Encounter (Signed)
Attempted to reach pt and left detailed message on home # and to call if any questions. 

## 2011-08-29 NOTE — Telephone Encounter (Signed)
Pls call pt and let her know that her vitamin D level looks good. She can stop weekly supplement and start a once a day otc supplement as below.

## 2011-09-02 ENCOUNTER — Encounter: Payer: Self-pay | Admitting: Family

## 2011-09-02 ENCOUNTER — Ambulatory Visit (INDEPENDENT_AMBULATORY_CARE_PROVIDER_SITE_OTHER): Payer: 59 | Admitting: Family

## 2011-09-02 VITALS — BP 126/82 | HR 80 | Temp 98.0°F | Resp 16 | Ht 62.0 in | Wt 177.1 lb

## 2011-09-02 DIAGNOSIS — E785 Hyperlipidemia, unspecified: Secondary | ICD-10-CM

## 2011-09-02 DIAGNOSIS — E559 Vitamin D deficiency, unspecified: Secondary | ICD-10-CM

## 2011-09-02 DIAGNOSIS — I10 Essential (primary) hypertension: Secondary | ICD-10-CM

## 2011-09-02 DIAGNOSIS — K219 Gastro-esophageal reflux disease without esophagitis: Secondary | ICD-10-CM

## 2011-09-02 LAB — BASIC METABOLIC PANEL
BUN: 28 mg/dL — ABNORMAL HIGH (ref 6–23)
CO2: 26 mEq/L (ref 19–32)
Chloride: 103 mEq/L (ref 96–112)
Glucose, Bld: 102 mg/dL — ABNORMAL HIGH (ref 70–99)
Potassium: 4.3 mEq/L (ref 3.5–5.3)

## 2011-09-02 NOTE — Progress Notes (Signed)
  Subjective:    Patient ID: Jade Gross, female    DOB: 05-30-1945, 66 y.o.   MRN: [id number]  HPI  Jade Gross is a 66 yr old female who presents today for follow up.    HTN- on hctz, no swelling, cp or sob.    GERD-  Takes prilosec reports that reflux is well controlled.  Hyperlipidemia- Not watching diet, not exercising.      Review of Systems See HPI  Past Medical History  Diagnosis Date  . Hypertension   . Hyperlipidemia   . Borderline glaucoma     History   Social History  . Marital Status: Divorced    Spouse Name: N/A    Number of Children: 1  . Years of Education: N/A   Occupational History  .  [employer]   Social History Main Topics  . Smoking status: Never Smoker   . Smokeless tobacco: Never Used  . Alcohol Use: No  . Drug Use: No  . Sexually Active: Not on file   Other Topics Concern  . Not on file   Social History Narrative   Caffeine use:  <1 dailyRegular exercise:  NoNever smokedWork at [employer] as a Merchandiser, retail in accountingDivorcedShe lives with roommateCompleted 12th grade    Past Surgical History  Procedure Date  . Cataract surgery, lens implant and retinal repain 2005    left eye  . Cataract surger/lens implant 2000    right eye    Family History  Problem Relation Age of Onset  . Heart disease Mother     Pacemaker at age 105  . Hypertension Mother   . Heart disease Father     died at 23 from MI, he had first heart attack at 37  . Stroke Maternal Aunt   . Cancer Neg Hx   . Heart disease      died at 6 from heart attack (Brother's son)    No Known Allergies  Current Outpatient Prescriptions on File Prior to Visit  Medication Sig Dispense Refill  . clotrimazole-betamethasone (LOTRISONE) cream Apply topically 2 (two) times daily.  30 g  1  . hydrochlorothiazide (HYDRODIURIL) 25 MG tablet TAKE 1 TABLET (25 MG TOTAL) BY MOUTH DAILY.  30 tablet  5  . omeprazole (PRILOSEC) 20 MG capsule TAKE 1 CAPSULE (20 MG TOTAL) BY MOUTH DAILY.  30  capsule  2  . timolol (BETIMOL) 0.25 % ophthalmic solution Place 1 drop into both eyes daily.      . cholecalciferol (VITAMIN D) 1000 UNITS tablet Take 3,000 Units by mouth daily.        BP 126/82  Pulse 80  Temp 98 F (36.7 C) (Oral)  Resp 16  Ht 5\' 2"  (1.575 m)  Wt 177 lb 1.9 oz (80.341 kg)  BMI 32.40 kg/m2  SpO2 97%  LMP 02/05/1996       Objective:   Physical Exam  Constitutional: She appears well-developed and well-nourished. No distress.  Cardiovascular: Normal rate and regular rhythm.   No murmur heard. Pulmonary/Chest: Effort normal and breath sounds normal. No respiratory distress. She has no wheezes. She has no rales. She exhibits no tenderness.  Musculoskeletal: She exhibits no edema.  Psychiatric: She has a normal mood and affect. Her behavior is normal. Judgment and thought content normal.          Assessment & Plan:

## 2011-09-02 NOTE — Assessment & Plan Note (Signed)
Follow up vitamin D normal.  Continue otc supplement.

## 2011-09-02 NOTE — Assessment & Plan Note (Signed)
Pt counseled on low fat, low cholesterol diet and exercise. Not fasting today.  Plan to recheck lipid panel at next visit.

## 2011-09-02 NOTE — Patient Instructions (Addendum)
Follow up in January for a fasting physical. Complete your blood work prior to leaving.

## 2011-09-02 NOTE — Assessment & Plan Note (Signed)
Stable on PPI, continue same.  

## 2011-09-02 NOTE — Assessment & Plan Note (Signed)
Clinically stable on hctz- continue same.  Obtain BMET.

## 2011-09-03 ENCOUNTER — Encounter: Payer: Self-pay | Admitting: Family

## 2011-09-03 DIAGNOSIS — E119 Type 2 diabetes mellitus without complications: Secondary | ICD-10-CM | POA: Insufficient documentation

## 2011-11-10 ENCOUNTER — Other Ambulatory Visit: Payer: Self-pay | Admitting: Family

## 2011-12-17 ENCOUNTER — Other Ambulatory Visit: Payer: Self-pay | Admitting: Family

## 2012-02-04 ENCOUNTER — Ambulatory Visit (INDEPENDENT_AMBULATORY_CARE_PROVIDER_SITE_OTHER): Payer: 59 | Admitting: Family

## 2012-02-04 ENCOUNTER — Encounter: Payer: Self-pay | Admitting: Family

## 2012-02-04 ENCOUNTER — Other Ambulatory Visit (HOSPITAL_COMMUNITY)
Admission: RE | Admit: 2012-02-04 | Discharge: 2012-02-04 | Disposition: A | Discharge status: Home / Self Care | Payer: 59 | Source: Ambulatory Visit | Attending: Family | Admitting: Family

## 2012-02-04 VITALS — BP 140/90 | HR 80 | Temp 98.3°F | Resp 16 | Ht 62.25 in | Wt 173.1 lb

## 2012-02-04 DIAGNOSIS — I10 Essential (primary) hypertension: Secondary | ICD-10-CM

## 2012-02-04 DIAGNOSIS — Z0000 Encounter for general adult medical examination without abnormal findings: Secondary | ICD-10-CM

## 2012-02-04 DIAGNOSIS — Z01419 Encounter for gynecological examination (general) (routine) without abnormal findings: Secondary | ICD-10-CM

## 2012-02-04 DIAGNOSIS — E559 Vitamin D deficiency, unspecified: Secondary | ICD-10-CM

## 2012-02-04 DIAGNOSIS — E785 Hyperlipidemia, unspecified: Secondary | ICD-10-CM

## 2012-02-04 DIAGNOSIS — K219 Gastro-esophageal reflux disease without esophagitis: Secondary | ICD-10-CM

## 2012-02-04 LAB — CBC WITH DIFFERENTIAL/PLATELET
Basophils Absolute: 0.1 10*3/uL (ref 0.0–0.1)
Eosinophils Relative: 2 % (ref 0–5)
Lymphocytes Relative: 27 % (ref 12–46)
Lymphs Abs: 1.5 10*3/uL (ref 0.7–4.0)
Neutro Abs: 3.7 10*3/uL (ref 1.7–7.7)
Neutrophils Relative %: 64 % (ref 43–77)
Platelets: 316 10*3/uL (ref 150–400)
RBC: 4.54 MIL/uL (ref 3.87–5.11)
RDW: 15.4 % (ref 11.5–15.5)
WBC: 5.8 10*3/uL (ref 4.0–10.5)

## 2012-02-04 LAB — LIPID PANEL
Cholesterol: 279 mg/dL — ABNORMAL HIGH (ref 0–200)
HDL: 45 mg/dL (ref >39)
Triglycerides: 146 mg/dL (ref <150)

## 2012-02-04 LAB — PSA: PSA: NORMAL

## 2012-02-04 LAB — HEPATIC FUNCTION PANEL
ALT: 12 U/L (ref 0–35)
AST: 16 U/L (ref 0–37)
Total Protein: 7.2 g/dL (ref 6.0–8.3)

## 2012-02-04 LAB — BASIC METABOLIC PANEL WITH GFR
BUN: 29 mg/dL — ABNORMAL HIGH (ref 6–23)
Calcium: 9.8 mg/dL (ref 8.4–10.5)
GFR, Est African American: 75 mL/min
Glucose, Bld: 100 mg/dL — ABNORMAL HIGH (ref 70–99)

## 2012-02-04 LAB — TSH: TSH: 1.394 u[IU]/mL (ref 0.350–4.500)

## 2012-02-04 MED ORDER — CLOTRIMAZOLE-BETAMETHASONE 1-0.05 % EX CREA: TOPICAL_CREAM | Freq: Two times a day (BID) | CUTANEOUS | Status: DC

## 2012-02-04 NOTE — Assessment & Plan Note (Signed)
Check vitamin D level 

## 2012-02-04 NOTE — Assessment & Plan Note (Signed)
Stable.  Continue PPI. 

## 2012-02-04 NOTE — Progress Notes (Signed)
Subjective:    Patient ID: Jade Gross, female    DOB: January 16, 1946, 67 y.o.   MRN: [id number]  HPI   Patient presents today for complete physical.  Immunizations:up to date on flu, tetanus and pneumovax Diet: generally healthy diet. Exercise: Not for >1 month Colonoscopy: last colo 2000- will consider Dexa: last year Pap Smear: up to date- Mammogram: Due in March  HTN- continues HCTZ.  Reports 126/71 at work.    GERD- reports well controled with prilosec.    Vitamin D- continues supplement.     Review of Systems  Constitutional: Negative for unexpected weight change.  HENT: Negative for hearing loss.   Eyes: Negative for visual disturbance.  Respiratory: Negative for shortness of breath.   Cardiovascular: Negative for chest pain and leg swelling.  Gastrointestinal: Negative for vomiting, constipation and blood in stool.  Genitourinary: Negative for urgency and frequency.  Musculoskeletal: Negative for myalgias.       Arthritis in hands  Skin:       Patch in hairline- improve with lotrisone  Neurological: Negative for headaches.  Hematological: Negative for adenopathy.  Psychiatric/Behavioral:       Denies depression/anxiety    Past Medical History  Diagnosis Date  . Hypertension   . Hyperlipidemia   . Borderline glaucoma     History   Social History  . Marital Status: Divorced    Spouse Name: N/A    Number of Children: 1  . Years of Education: N/A   Occupational History  .  [place]   Social History Main Topics  . Smoking status: Never Smoker   . Smokeless tobacco: Never Used  . Alcohol Use: No  . Drug Use: No  . Sexually Active: Not on file   Other Topics Concern  . Not on file   Social History Narrative   Caffeine use:  <1 dailyRegular exercise:  NoNever smokedWork at [employer] as a Merchandiser, retail in accountingDivorcedShe lives with roommateCompleted 12th grade    Past Surgical History  Procedure Date  . Cataract surgery, lens implant and  retinal repain 2005    left eye  . Cataract surger/lens implant 2000    right eye  . Bunionectomy ?2000    bunion removed from both feet    Family History  Problem Relation Age of Onset  . Heart disease Mother     Pacemaker at age 61  . Hypertension Mother   . Heart disease Father     died at 6 from MI, he had first heart attack at 16  . Stroke Maternal Aunt   . Cancer Neg Hx   . Heart disease      died at 52 from heart attack (Brother's son)  . Heart attack Maternal Grandmother     No Known Allergies  Current Outpatient Prescriptions on File Prior to Visit  Medication Sig Dispense Refill  . Calcium Carbonate-Vitamin D (CALTRATE 600+D) 600-400 MG-UNIT per tablet Take 1 tablet by mouth 2 (two) times daily.      . cholecalciferol (VITAMIN D) 1000 UNITS tablet Take 3,000 Units by mouth daily.      . hydrochlorothiazide (HYDRODIURIL) 25 MG tablet TAKE 1 TABLET BY MOUTH ONCE DAILY  30 tablet  3  . omeprazole (PRILOSEC) 20 MG capsule TAKE 1 CAPSULE (20 MG TOTAL) BY MOUTH DAILY.  30 capsule  4  . timolol (BETIMOL) 0.25 % ophthalmic solution Place 1 drop into both eyes daily.        BP 140/90  Pulse  80  Temp 98.3 F (36.8 C) (Oral)  Resp 16  Ht 5' 2.25" (1.581 m)  Wt 173 lb 1.9 oz (78.527 kg)  BMI 31.41 kg/m2  SpO2 98%  LMP 02/05/1996        Objective:   Physical Exam  Physical Exam  Constitutional: She is oriented to person, place, and time. She appears well-developed and well-nourished. No distress.  HENT:  Head: Normocephalic and atraumatic.  Right Ear: Tympanic membrane and ear canal normal.  Left Ear: Tympanic membrane and ear canal normal.  Mouth/Throat: Oropharynx is clear and moist.  Eyes: Pupils are equal, round, and reactive to light. No scleral icterus.  Neck: Normal range of motion. No thyromegaly present.  Cardiovascular: Normal rate and regular rhythm.   No murmur heard. Pulmonary/Chest: Effort normal and breath sounds normal. No respiratory  distress. He has no wheezes. She has no rales. She exhibits no tenderness.  Abdominal: Soft. Bowel sounds are normal. He exhibits no distension and no mass. There is no tenderness. There is no rebound and no guarding.  Musculoskeletal: She exhibits no edema.  Lymphadenopathy:    She has no cervical adenopathy.  Neurological: She is alert and oriented to person, place, and time. She has 1+ bilateral patellar reflexes. She exhibits normal muscle tone. Coordination normal.  Skin: Skin is warm and dry.  Psychiatric: She has a normal mood and affect. Her behavior is normal. Judgment and thought content normal.  Breasts: Examined lying Right: Without masses, retractions, discharge or axillary adenopathy.  Left: Without masses, retractions, discharge or axillary adenopathy.  Inguinal/mons: Normal without inguinal adenopathy  External genitalia: Normal  BUS/Urethra/Skene's glands: Normal  Bladder: Normal  Vagina: Normal atrophic.  Vulva- some small varicosities noted. Cervix: Normal  Uterus: normal in size, shape and contour. Midline and mobile  Adnexa/parametria:  Rt: Without masses or tenderness.  Lt: Without masses or tenderness.  Anus and perineum: Normal           Assessment & Plan:

## 2012-02-04 NOTE — Assessment & Plan Note (Signed)
·   Obtain FLP

## 2012-02-04 NOTE — Addendum Note (Signed)
Addended by: Mervin Kung A on: 02/04/2012 09:31 AM   Modules accepted: Orders

## 2012-02-04 NOTE — Assessment & Plan Note (Signed)
BP stable.  Continue hctz, obtain bmet.

## 2012-02-04 NOTE — Assessment & Plan Note (Signed)
Pt counseled on diet, exercise.  Immunizations reviewed and up to date.  Pap performed due to lack of endocervical component last visit.  Mammogram ordered.  Declines colo- agreeable to IFOB.

## 2012-02-04 NOTE — Patient Instructions (Addendum)
Complete your lab work prior to leaving.  Please schedule your mammogram on the first floor.  This should be scheduled after 04/01/12. Please let me know when you are ready to schedule your colonoscopy.   Complete your stool test and return. Follow up in 6 months.

## 2012-02-05 LAB — URINALYSIS, MICROSCOPIC ONLY: Crystals: NONE SEEN

## 2012-02-05 LAB — URINALYSIS, ROUTINE W REFLEX MICROSCOPIC
Glucose, UA: NEGATIVE mg/dL
Ketones, ur: NEGATIVE mg/dL
Nitrite: NEGATIVE
Specific Gravity, Urine: >1.03 — ABNORMAL HIGH (ref 1.005–1.030)
pH: 5 (ref 5.0–8.0)

## 2012-02-06 ENCOUNTER — Telehealth: Payer: Self-pay | Admitting: Family

## 2012-02-06 MED ORDER — CIPROFLOXACIN HCL 500 MG PO TABS: 500.0000 mg | ORAL_TABLET | Freq: Two times a day (BID) | ORAL | Status: DC

## 2012-02-06 NOTE — Telephone Encounter (Signed)
Left detailed message on home # re: instructions below and to call Monday if any questions.

## 2012-02-06 NOTE — Telephone Encounter (Addendum)
Pls let pt know UA shows UTI.  Will rx with cipro.  Cholesterol very high.  Low fat diet, exercise x 3 months then repeat.    Pap smear is negative.

## 2012-02-13 ENCOUNTER — Other Ambulatory Visit: Payer: Self-pay | Admitting: Family

## 2012-02-13 DIAGNOSIS — Z1231 Encounter for screening mammogram for malignant neoplasm of breast: Secondary | ICD-10-CM

## 2012-02-19 ENCOUNTER — Telehealth: Payer: Self-pay | Admitting: Family

## 2012-02-19 NOTE — Telephone Encounter (Signed)
Detailed message left on home number and to call if any questions.

## 2012-02-19 NOTE — Telephone Encounter (Signed)
Please remind pt to complete IFOB and return.

## 2012-03-04 ENCOUNTER — Telehealth: Payer: Self-pay | Admitting: *Deleted

## 2012-03-04 ENCOUNTER — Other Ambulatory Visit (INDEPENDENT_AMBULATORY_CARE_PROVIDER_SITE_OTHER): Payer: 59

## 2012-03-04 DIAGNOSIS — Z0000 Encounter for general adult medical examination without abnormal findings: Secondary | ICD-10-CM

## 2012-03-04 NOTE — Telephone Encounter (Signed)
IFOB order placed

## 2012-03-05 LAB — FECAL OCCULT BLOOD, IMMUNOCHEMICAL: Fecal Occult Bld: POSITIVE

## 2012-03-06 ENCOUNTER — Telehealth: Payer: Self-pay | Admitting: Family

## 2012-03-06 DIAGNOSIS — R195 Other fecal abnormalities: Secondary | ICD-10-CM

## 2012-03-06 NOTE — Telephone Encounter (Signed)
Please call pt an let her know that her IFOB is +.  I think she needs to be seen by GI and have colonoscopy.  Referral pended below.

## 2012-03-09 NOTE — Telephone Encounter (Signed)
Left message on home # to return my call. 

## 2012-03-15 NOTE — Telephone Encounter (Signed)
Left message on voicemail to return my call.  

## 2012-03-17 NOTE — Telephone Encounter (Signed)
Left message on home # to return my call. 

## 2012-03-18 NOTE — Telephone Encounter (Signed)
Unable to reach pt, mailed letter.  

## 2012-03-19 ENCOUNTER — Encounter: Payer: Self-pay | Admitting: Gastroenterology

## 2012-04-06 ENCOUNTER — Ambulatory Visit: Payer: 59 | Admitting: Gastroenterology

## 2012-04-08 ENCOUNTER — Other Ambulatory Visit: Payer: Self-pay | Admitting: Family

## 2012-04-08 NOTE — Telephone Encounter (Signed)
Rx[s] to pharmacy/[facility] 

## 2012-04-23 ENCOUNTER — Ambulatory Visit: Payer: 59 | Admitting: Gastroenterology

## 2012-05-21 ENCOUNTER — Ambulatory Visit: Payer: 59 | Admitting: Gastroenterology

## 2012-06-08 ENCOUNTER — Encounter: Payer: Self-pay | Admitting: Family

## 2012-06-29 ENCOUNTER — Ambulatory Visit: Payer: 59 | Admitting: Gastroenterology

## 2012-08-03 ENCOUNTER — Ambulatory Visit: Payer: 59 | Admitting: Family

## 2012-08-18 ENCOUNTER — Other Ambulatory Visit: Payer: Self-pay | Admitting: Family

## 2012-09-01 ENCOUNTER — Encounter: Payer: Self-pay | Admitting: Family

## 2012-09-01 ENCOUNTER — Ambulatory Visit (INDEPENDENT_AMBULATORY_CARE_PROVIDER_SITE_OTHER): Payer: 59 | Admitting: Family

## 2012-09-01 VITALS — BP 120/82 | HR 79 | Temp 98.2°F | Resp 16 | Wt 174.0 lb

## 2012-09-01 DIAGNOSIS — E785 Hyperlipidemia, unspecified: Secondary | ICD-10-CM

## 2012-09-01 DIAGNOSIS — I10 Essential (primary) hypertension: Secondary | ICD-10-CM

## 2012-09-01 DIAGNOSIS — K219 Gastro-esophageal reflux disease without esophagitis: Secondary | ICD-10-CM

## 2012-09-01 LAB — BASIC METABOLIC PANEL
BUN: 25 mg/dL — ABNORMAL HIGH (ref 6–23)
CO2: 31 mEq/L (ref 19–32)
Calcium: 10.1 mg/dL (ref 8.4–10.5)
Chloride: 97 mEq/L (ref 96–112)
Creat: 1.03 mg/dL (ref 0.50–1.10)
Glucose, Bld: 112 mg/dL — ABNORMAL HIGH (ref 70–99)

## 2012-09-01 LAB — HEPATIC FUNCTION PANEL
AST: 14 U/L (ref 0–37)
Albumin: 4.3 g/dL (ref 3.5–5.2)
Alkaline Phosphatase: 99 U/L (ref 39–117)
Bilirubin, Direct: 0.1 mg/dL (ref 0.0–0.3)
Indirect Bilirubin: 0.3 mg/dL (ref 0.0–0.9)
Total Bilirubin: 0.4 mg/dL (ref 0.3–1.2)

## 2012-09-01 LAB — LIPID PANEL
HDL: 42 mg/dL (ref >39)
LDL Cholesterol: 172 mg/dL — ABNORMAL HIGH (ref 0–99)

## 2012-09-01 MED ORDER — HYDROCHLOROTHIAZIDE 25 MG PO TABS: 25.0000 mg | ORAL_TABLET | Freq: Every day | ORAL | Status: DC

## 2012-09-01 MED ORDER — OMEPRAZOLE 20 MG PO CPDR: 20.0000 mg | DELAYED_RELEASE_CAPSULE | Freq: Every day | ORAL | Status: DC

## 2012-09-01 NOTE — Assessment & Plan Note (Signed)
BP stable on HCTZ.  Continue same, obtain bmet.

## 2012-09-01 NOTE — Progress Notes (Signed)
Subjective:    Patient ID: Jade Gross, female    DOB: 22-Nov-1945, 67 y.o.   MRN: [id number]  HPI  Jade Gross is a 67 yr old female who presents today for follow up.  HTN- on hctz, denies CP/SOB/swelling  Hyperlipidemia-Not doing great with diet.  Works for [employer]  GERD- reports well controlled on omeprazoele  Review of Systems    see HPI  Past Medical History  Diagnosis Date  . Hypertension   . Hyperlipidemia   . Borderline glaucoma     History   Social History  . Marital Status: Divorced    Spouse Name: N/A    Number of Children: 1  . Years of Education: N/A   Occupational History  .  [place]   Social History Main Topics  . Smoking status: Never Smoker   . Smokeless tobacco: Never Used  . Alcohol Use: No  . Drug Use: No  . Sexually Active: Not on file   Other Topics Concern  . Not on file   Social History Narrative   Caffeine use:  <1 daily   Regular exercise:  No   Never smoked   Work at [employer] as a Merchandiser, retail in Audiological scientist   Divorced   She lives with roommate   Completed 12th grade          Past Surgical History  Procedure Laterality Date  . Cataract surgery, lens implant and retinal repain  2005    left eye  . Cataract surger/lens implant  2000    right eye  . Bunionectomy  ?2000    bunion removed from both feet    Family History  Problem Relation Age of Onset  . Heart disease Mother     Pacemaker at age 60  . Hypertension Mother   . Heart disease Father     died at 31 from MI, he had first heart attack at 74  . Stroke Maternal Aunt   . Cancer Neg Hx   . Heart disease      died at 67 from heart attack (Brother's son)  . Heart attack Maternal Grandmother     No Known Allergies  Current Outpatient Prescriptions on File Prior to Visit  Medication Sig Dispense Refill  . Calcium Carbonate-Vitamin D (CALTRATE 600+D) 600-400 MG-UNIT per tablet Take 1 tablet by mouth 2 (two) times daily.      . cholecalciferol (VITAMIN D) 1000 UNITS  tablet Take 3,000 Units by mouth daily.      . clotrimazole-betamethasone (LOTRISONE) cream Apply topically 2 (two) times daily.  30 g  1  . timolol (BETIMOL) 0.25 % ophthalmic solution Place 1 drop into both eyes daily.       No current facility-administered medications on file prior to visit.    BP 120/82  Pulse 79  Temp(Src) 98.2 F (36.8 C) (Oral)  Resp 16  Wt 174 lb (78.926 kg)  BMI 31.58 kg/m2  SpO2 97%  LMP 02/05/1996    Objective:   Physical Exam  Constitutional: She is oriented to person, place, and time. She appears well-developed and well-nourished. No distress.  Cardiovascular: Normal rate and regular rhythm.   No murmur heard. Pulmonary/Chest: Effort normal and breath sounds normal. No respiratory distress. She has no wheezes. She has no rales. She exhibits no tenderness.  Musculoskeletal: She exhibits no edema.  Neurological: She is alert and oriented to person, place, and time.  Psychiatric: She has a normal mood and affect. Her behavior is normal. Judgment  and thought content normal.          Assessment & Plan:

## 2012-09-01 NOTE — Patient Instructions (Addendum)
Complete lab work prior to leaving.  Please follow up in 6 months.

## 2012-09-01 NOTE — Assessment & Plan Note (Signed)
Check flp/lft.  Pt is agreeable to statin therapy if not significantly improved.

## 2012-09-01 NOTE — Assessment & Plan Note (Signed)
Clinically stable on PPI.  Continue same.  

## 2012-09-03 ENCOUNTER — Other Ambulatory Visit: Payer: Self-pay | Admitting: Family

## 2012-09-03 NOTE — Telephone Encounter (Signed)
Please contact pt and let her know that her cholesterol remains elevated.  I would like her to start atorvastatin to help bring down her cholesterol.  She should contact us if abnormal muscle pain occurs after starting this medication.  Follow up in 6 weeks for flp/lft (dx hyperlipidemia).  Also work on low fat/low cholesterol diet and exercise.  Kidney function is stable.  Sugar remains mildly elevated but not in diabetic range. We will continue to monitor this.

## 2012-09-06 NOTE — Telephone Encounter (Signed)
Left message for pt to return my call.

## 2012-09-07 NOTE — Telephone Encounter (Signed)
Left detailed message on pt's home # and to call and let us know if she wants to proceed with medication.

## 2012-09-16 NOTE — Telephone Encounter (Signed)
Mailed letter to pt. Will send rx (atorvastatin 10mg  1 daily, # 30 x 2 rf) and place future lab order when pt calls back.

## 2012-12-02 ENCOUNTER — Other Ambulatory Visit: Payer: Self-pay

## 2013-02-02 ENCOUNTER — Ambulatory Visit (INDEPENDENT_AMBULATORY_CARE_PROVIDER_SITE_OTHER): Payer: 59 | Admitting: Family

## 2013-02-02 ENCOUNTER — Encounter: Payer: Self-pay | Admitting: Family

## 2013-02-02 VITALS — BP 140/80 | HR 89 | Temp 98.0°F | Resp 16 | Ht 62.25 in | Wt 175.0 lb

## 2013-02-02 DIAGNOSIS — L853 Xerosis cutis: Secondary | ICD-10-CM | POA: Insufficient documentation

## 2013-02-02 DIAGNOSIS — I10 Essential (primary) hypertension: Secondary | ICD-10-CM

## 2013-02-02 DIAGNOSIS — R739 Hyperglycemia, unspecified: Secondary | ICD-10-CM

## 2013-02-02 DIAGNOSIS — E785 Hyperlipidemia, unspecified: Secondary | ICD-10-CM

## 2013-02-02 DIAGNOSIS — E119 Type 2 diabetes mellitus without complications: Secondary | ICD-10-CM

## 2013-02-02 DIAGNOSIS — L738 Other specified follicular disorders: Secondary | ICD-10-CM

## 2013-02-02 DIAGNOSIS — R7309 Other abnormal glucose: Secondary | ICD-10-CM

## 2013-02-02 LAB — BASIC METABOLIC PANEL
BUN: 26 mg/dL — AB (ref 6–23)
CHLORIDE: 103 meq/L (ref 96–112)
CO2: 26 meq/L (ref 19–32)
Calcium: 9.9 mg/dL (ref 8.4–10.5)
Creat: 1.02 mg/dL (ref 0.50–1.10)
GLUCOSE: 95 mg/dL (ref 70–99)
POTASSIUM: 4.5 meq/L (ref 3.5–5.3)
Sodium: 139 mEq/L (ref 135–145)

## 2013-02-02 LAB — HEMOGLOBIN A1C
Hgb A1c MFr Bld: 5.8 % — ABNORMAL HIGH (ref <5.7)
Mean Plasma Glucose: 120 mg/dL — ABNORMAL HIGH (ref <117)

## 2013-02-02 NOTE — Assessment & Plan Note (Signed)
Mild, check A1C.

## 2013-02-02 NOTE — Assessment & Plan Note (Signed)
BP is fair. Pt feels that her BP will improve after she leaves her job which she plans to do in the next few months.

## 2013-02-02 NOTE — Patient Instructions (Signed)
Please complete your lab work prior to leaving. Follow up in 3 months.   

## 2013-02-02 NOTE — Progress Notes (Signed)
Subjective:    Patient ID: Jade Gross, female    DOB: May 12, 1945, 68 y.o.   MRN: [id number]  HPI  Jade Gross is a 68 yr old female who presents today for follow up of multiple medical problems.  1) HTN- she is currently maintained on hctz.  Reports a lot of stress at work with layoffs.  BP Readings from Last 3 Encounters:  02/02/13 140/80  09/01/12 120/82  02/04/12 140/90    2) Hyperlipidemia- She is not currently on a statin. Lab Results  Component Value Date   CHOL 241* 09/01/2012   HDL 42 09/01/2012   LDLCALC 172* 09/01/2012   TRIG 133 09/01/2012   CHOLHDL 5.7 09/01/2012  Reports that her diet is improving.  But not "real good."    3) hyperglycemia- She has had blood sugars just above normal the last few visits.    4) Dry skin- notes dry itching skin on chest and back  Review of Systems See HPI  Past Medical History  Diagnosis Date  . Hypertension   . Hyperlipidemia   . Borderline glaucoma     History   Social History  . Marital Status: Divorced    Spouse Name: N/A    Number of Children: 1  . Years of Education: N/A   Occupational History  .  [place]   Social History Main Topics  . Smoking status: Never Smoker   . Smokeless tobacco: Never Used  . Alcohol Use: No  . Drug Use: No  . Sexual Activity: Not on file   Other Topics Concern  . Not on file   Social History Narrative   Caffeine use:  <1 daily   Regular exercise:  No   Never smoked   Work at [employer] as a Librarian, academic in Press photographer   Divorced   She lives with roommate   Completed 12th grade          Past Surgical History  Procedure Laterality Date  . Cataract surgery, lens implant and retinal repain  2005    left eye  . Cataract surger/lens implant  2000    right eye  . Bunionectomy  ?2000    bunion removed from both feet    Family History  Problem Relation Age of Onset  . Heart disease Mother     Pacemaker at age 6  . Hypertension Mother   . Heart disease Father     died at 39  from MI, he had first heart attack at 8  . Stroke Maternal Aunt   . Cancer Neg Hx   . Heart disease      died at 37 from heart attack (Brother's son)  . Heart attack Maternal Grandmother     No Known Allergies  Current Outpatient Prescriptions on File Prior to Visit  Medication Sig Dispense Refill  . Calcium Carbonate-Vitamin D (CALTRATE 600+D) 600-400 MG-UNIT per tablet Take 1 tablet by mouth 2 (two) times daily.      . cholecalciferol (VITAMIN D) 1000 UNITS tablet Take 3,000 Units by mouth daily.      . clotrimazole-betamethasone (LOTRISONE) cream Apply topically 2 (two) times daily.  30 g  1  . hydrochlorothiazide (HYDRODIURIL) 25 MG tablet Take 1 tablet (25 mg total) by mouth daily.  90 tablet  1  . omeprazole (PRILOSEC) 20 MG capsule Take 1 capsule (20 mg total) by mouth daily.  30 capsule  5  . timolol (BETIMOL) 0.25 % ophthalmic solution Place 1 drop into both  eyes daily.       No current facility-administered medications on file prior to visit.    BP 140/80  Pulse 89  Temp(Src) 98 F (36.7 C) (Oral)  Resp 16  Ht 5' 2.25" (1.581 m)  Wt 175 lb (79.379 kg)  BMI 31.76 kg/m2  SpO2 97%  LMP 02/05/1996       Objective:   Physical Exam  Constitutional: She is oriented to person, place, and time. She appears well-developed and well-nourished.  HENT:  Head: Normocephalic and atraumatic.  Cardiovascular: Normal rate and regular rhythm.   No murmur heard. Pulmonary/Chest: Effort normal and breath sounds normal. No respiratory distress. She has no wheezes. She has no rales. She exhibits no tenderness.  Musculoskeletal: She exhibits no edema.  Neurological: She is alert and oriented to person, place, and time.  Skin:  Several small scabbed lesions noted on chest, breasts, dry skin noted.  Psychiatric: She has a normal mood and affect. Her behavior is normal. Judgment and thought content normal.          Assessment & Plan:

## 2013-02-02 NOTE — Assessment & Plan Note (Signed)
low cholesterol diet, follow up flp next visit.

## 2013-02-02 NOTE — Assessment & Plan Note (Signed)
Advised pt re: good moisturizers and prn claritin or zyrtec for itching. ? Associated with stress.

## 2013-02-02 NOTE — Progress Notes (Signed)
Pre visit review using our clinic review tool, if applicable. No additional management support is needed unless otherwise documented below in the visit note. 

## 2013-02-03 ENCOUNTER — Telehealth: Payer: Self-pay | Admitting: Family

## 2013-02-03 ENCOUNTER — Telehealth: Payer: Self-pay

## 2013-02-03 NOTE — Telephone Encounter (Signed)
Relevant patient education assigned to patient using Emmi. ° °

## 2013-02-24 ENCOUNTER — Other Ambulatory Visit: Payer: Self-pay | Admitting: Family

## 2013-02-25 NOTE — Telephone Encounter (Signed)
Refill sent. Left detailed message on home # and to call if any questions.

## 2013-02-25 NOTE — Telephone Encounter (Signed)
Patient states that her insurance ends tomorrow and would like this done by this evening.

## 2013-05-03 ENCOUNTER — Ambulatory Visit: Payer: 59 | Admitting: Family

## 2013-05-09 NOTE — Telephone Encounter (Signed)
error 

## 2013-05-11 ENCOUNTER — Ambulatory Visit: Payer: 59 | Admitting: Family

## 2013-05-18 ENCOUNTER — Other Ambulatory Visit: Payer: Self-pay | Admitting: Family

## 2013-05-18 NOTE — Telephone Encounter (Signed)
Rx request to pharmacy; 30-day supply as pt canceled follow-up OV in April 2015 [has OV scheduled 05.15.15], Office Visit Needed Prior to Future Refills/SLS

## 2013-06-06 DIAGNOSIS — H40059 Ocular hypertension, unspecified eye: Secondary | ICD-10-CM | POA: Diagnosis not present

## 2013-06-10 ENCOUNTER — Ambulatory Visit: Payer: 59 | Admitting: Family

## 2013-06-15 ENCOUNTER — Ambulatory Visit (INDEPENDENT_AMBULATORY_CARE_PROVIDER_SITE_OTHER): Payer: Medicare Other | Admitting: Family

## 2013-06-15 ENCOUNTER — Encounter: Payer: Self-pay | Admitting: Family

## 2013-06-15 ENCOUNTER — Other Ambulatory Visit: Payer: Self-pay | Admitting: Family

## 2013-06-15 VITALS — BP 148/88 | HR 73 | Temp 97.8°F | Resp 16 | Ht 62.25 in | Wt 173.1 lb

## 2013-06-15 DIAGNOSIS — I10 Essential (primary) hypertension: Secondary | ICD-10-CM

## 2013-06-15 DIAGNOSIS — R7309 Other abnormal glucose: Secondary | ICD-10-CM | POA: Diagnosis not present

## 2013-06-15 DIAGNOSIS — E785 Hyperlipidemia, unspecified: Secondary | ICD-10-CM

## 2013-06-15 DIAGNOSIS — R739 Hyperglycemia, unspecified: Secondary | ICD-10-CM

## 2013-06-15 LAB — BASIC METABOLIC PANEL WITH GFR
BUN: 19 mg/dL (ref 6–23)
CHLORIDE: 104 meq/L (ref 96–112)
CO2: 27 meq/L (ref 19–32)
CREATININE: 0.81 mg/dL (ref 0.50–1.10)
Calcium: 9.5 mg/dL (ref 8.4–10.5)
GFR, Est African American: 87 mL/min
GFR, Est Non African American: 75 mL/min
Glucose, Bld: 98 mg/dL (ref 70–99)
Potassium: 4.6 mEq/L (ref 3.5–5.3)
Sodium: 138 mEq/L (ref 135–145)

## 2013-06-15 LAB — LIPID PANEL
CHOLESTEROL: 224 mg/dL — AB (ref 0–200)
HDL: 44 mg/dL (ref >39)
LDL CALC: 156 mg/dL — AB (ref 0–99)
TRIGLYCERIDES: 120 mg/dL (ref <150)
Total CHOL/HDL Ratio: 5.1 Ratio
VLDL: 24 mg/dL (ref 0–40)

## 2013-06-15 LAB — HEMOGLOBIN A1C
Hgb A1c MFr Bld: 5.9 % — ABNORMAL HIGH (ref <5.7)
Mean Plasma Glucose: 123 mg/dL — ABNORMAL HIGH (ref <117)

## 2013-06-15 MED ORDER — HYDROCHLOROTHIAZIDE 25 MG PO TABS: 25.0000 mg | ORAL_TABLET | Freq: Every day | ORAL | Status: DC

## 2013-06-15 MED ORDER — OMEPRAZOLE 20 MG PO CPDR: DELAYED_RELEASE_CAPSULE | ORAL | Status: DC

## 2013-06-15 NOTE — Patient Instructions (Addendum)
Please complete lab work prior to leaving. Follow up in 4 months.  

## 2013-06-15 NOTE — Progress Notes (Signed)
Subjective:    Patient ID: Jade Gross, female    DOB: 03/20/1945, 68 y.o.   MRN: [id number]  HPI  Jade Gross is a 68 yr old female who presents today for follow up of multiple medical problems.  1) Hyperglycemia-maintained on diabetic diet.  Last A1C was 5.8.  Walks regularly.    2) HTN- has not taken BP meds in 1 week.  BP Readings from Last 3 Encounters:  06/15/13 148/88  02/02/13 140/80  09/01/12 120/82    3) Hyperlipidemia-  Lab Results  Component Value Date   LDLCALC 172* 09/01/2012    Review of Systems See HPI  Past Medical History  Diagnosis Date  . Hypertension   . Hyperlipidemia   . Borderline glaucoma     History   Social History  . Marital Status: Divorced    Spouse Name: N/A    Number of Children: 1  . Years of Education: N/A   Occupational History  .  [place]   Social History Main Topics  . Smoking status: Never Smoker   . Smokeless tobacco: Never Used  . Alcohol Use: No  . Drug Use: No  . Sexual Activity: Not on file   Other Topics Concern  . Not on file   Social History Narrative   Caffeine use:  <1 daily   Regular exercise:  No   Never smoked   Work at [employer] as a Librarian, academic in Press photographer   Divorced   She lives with roommate   Completed 12th grade          Past Surgical History  Procedure Laterality Date  . Cataract surgery, lens implant and retinal repain  2005    left eye  . Cataract surger/lens implant  2000    right eye  . Bunionectomy  ?2000    bunion removed from both feet    Family History  Problem Relation Age of Onset  . Heart disease Mother     Pacemaker at age 59  . Hypertension Mother   . Heart disease Father     died at 26 from MI, he had first heart attack at 72  . Stroke Maternal Aunt   . Cancer Neg Hx   . Heart disease      died at 25 from heart attack (Brother's son)  . Heart attack Maternal Grandmother     No Known Allergies  Current Outpatient Prescriptions on File Prior to Visit    Medication Sig Dispense Refill  . Calcium Carbonate-Vitamin D (CALTRATE 600+D) 600-400 MG-UNIT per tablet Take 1 tablet by mouth 2 (two) times daily.      . cholecalciferol (VITAMIN D) 1000 UNITS tablet Take 3,000 Units by mouth daily.      . clotrimazole-betamethasone (LOTRISONE) cream APPLY TO THE AFFECTED AREA TWICE DAILY  30 g  1  . timolol (BETIMOL) 0.25 % ophthalmic solution Place 1 drop into both eyes daily.       No current facility-administered medications on file prior to visit.    BP 148/88  Pulse 73  Temp(Src) 97.8 F (36.6 C) (Oral)  Resp 16  Ht 5' 2.25" (1.581 m)  Wt 173 lb 1.9 oz (78.527 kg)  BMI 31.42 kg/m2  SpO2 95%  LMP 02/05/1996       Objective:   Physical Exam  Constitutional: She is oriented to person, place, and time. She appears well-developed and well-nourished. No distress.  HENT:  Head: Normocephalic and atraumatic.  Cardiovascular: Normal rate and  regular rhythm.   No murmur heard. Pulmonary/Chest: Effort normal and breath sounds normal. No respiratory distress. She has no wheezes. She has no rales. She exhibits no tenderness.  Musculoskeletal: She exhibits no edema.  Neurological: She is alert and oriented to person, place, and time.  Psychiatric: She has a normal mood and affect. Her behavior is normal. Thought content normal.          Assessment & Plan:

## 2013-06-15 NOTE — Telephone Encounter (Signed)
Cholesterol remains elevated.  I would recommend addition of statin (pended below) and repeat flp/lft in 6 weeks. Sugar remains mildly elevated.

## 2013-06-15 NOTE — Progress Notes (Signed)
Pre visit review using our clinic review tool, if applicable. No additional management support is needed unless otherwise documented below in the visit note. 

## 2013-06-17 NOTE — Assessment & Plan Note (Signed)
Deteriorated.  Resume HCTZ. Obtain bmet.

## 2013-06-17 NOTE — Telephone Encounter (Signed)
Left detailed message on home # and to call and verify pharmacy.

## 2013-06-17 NOTE — Assessment & Plan Note (Signed)
Obtain A1C, continue diabetic diet.

## 2013-06-17 NOTE — Assessment & Plan Note (Signed)
Lab Results  Component Value Date   CHOL 224* 06/15/2013   HDL 44 06/15/2013   LDLCALC 156* 06/15/2013   TRIG 120 06/15/2013   CHOLHDL 5.1 06/15/2013   Follow up lipids slightly better but still elevated. Plan continue to work on low fat/low cholesterol diet. Repeat flp in 6 months.

## 2013-06-24 MED ORDER — SIMVASTATIN 20 MG PO TABS: 20.0000 mg | ORAL_TABLET | Freq: Every day | ORAL | Status: DC

## 2013-06-24 NOTE — Telephone Encounter (Signed)
Letter mailed to pt, Rx sent. Future lab order entered.

## 2013-06-24 NOTE — Telephone Encounter (Signed)
Please send pt detailed letter.

## 2013-10-17 DIAGNOSIS — H40059 Ocular hypertension, unspecified eye: Secondary | ICD-10-CM | POA: Diagnosis not present

## 2013-10-19 ENCOUNTER — Ambulatory Visit: Payer: Medicare Other | Admitting: Family

## 2013-11-15 ENCOUNTER — Ambulatory Visit: Payer: Medicare Other | Admitting: Family

## 2013-12-14 ENCOUNTER — Ambulatory Visit: Payer: Medicare Other | Admitting: Family

## 2013-12-20 ENCOUNTER — Ambulatory Visit (INDEPENDENT_AMBULATORY_CARE_PROVIDER_SITE_OTHER): Payer: Medicare Other | Admitting: Family

## 2013-12-20 ENCOUNTER — Encounter: Payer: Self-pay | Admitting: Family

## 2013-12-20 ENCOUNTER — Other Ambulatory Visit: Payer: Self-pay | Admitting: Family

## 2013-12-20 VITALS — BP 140/90 | HR 89 | Temp 98.2°F | Resp 16 | Ht 62.25 in | Wt 166.0 lb

## 2013-12-20 DIAGNOSIS — R739 Hyperglycemia, unspecified: Secondary | ICD-10-CM

## 2013-12-20 DIAGNOSIS — E785 Hyperlipidemia, unspecified: Secondary | ICD-10-CM | POA: Diagnosis not present

## 2013-12-20 DIAGNOSIS — I10 Essential (primary) hypertension: Secondary | ICD-10-CM | POA: Diagnosis not present

## 2013-12-20 NOTE — Progress Notes (Signed)
Pre visit review using our clinic review tool, if applicable. No additional management support is needed unless otherwise documented below in the visit note. 

## 2013-12-20 NOTE — Telephone Encounter (Signed)
Please advise refill? Pt cancelled the last 3 appts

## 2013-12-20 NOTE — Assessment & Plan Note (Signed)
Obtain flp, if still elevated she is agreeable to statin.

## 2013-12-20 NOTE — Progress Notes (Signed)
Subjective:    Patient ID: Jade Gross, female    DOB: November 01, 1945, 68 y.o.   MRN: [id number]  HPI   Jade Gross is a 68 yr old female who presents today for follow up of multiple medical problems.  1) HTN-current bp meds include hctz, reports that her BP is "always better at home. Mom is at [facility]. And she has had family stress.  She reports bp at home 127/70's.  BP Readings from Last 3 Encounters:  12/20/13 140/90  06/15/13 148/88  02/02/13 140/80   2)  Hyperglycemia- on diet alone.   Lab Results  Component Value Date   HGBA1C 5.9* 06/15/2013   3) Hyperlipidemia- she did not start simvastatin.  Didn't know she needed to pick it up.  She has given up pepsi.    Lab Results  Component Value Date   CHOL 224* 06/15/2013   HDL 44 06/15/2013   LDLCALC 156* 06/15/2013   TRIG 120 06/15/2013   CHOLHDL 5.1 06/15/2013    Review of Systems    see HPI  Past Medical History  Diagnosis Date  . Hypertension   . Hyperlipidemia   . Borderline glaucoma     History   Social History  . Marital Status: Divorced    Spouse Name: N/A    Number of Children: 1  . Years of Education: N/A   Occupational History  .  [place]   Social History Main Topics  . Smoking status: Never Smoker   . Smokeless tobacco: Never Used  . Alcohol Use: No  . Drug Use: No  . Sexual Activity: Not on file   Other Topics Concern  . Not on file   Social History Narrative   Caffeine use:  <1 daily   Regular exercise:  No   Never smoked   Work at [employer] as a Librarian, academic in Press photographer   Divorced   She lives with roommate   Completed 12th grade          Past Surgical History  Procedure Laterality Date  . Cataract surgery, lens implant and retinal repain  2005    left eye  . Cataract surger/lens implant  2000    right eye  . Bunionectomy  ?2000    bunion removed from both feet    Family History  Problem Relation Age of Onset  . Heart disease Mother     Pacemaker at age 50  .  Hypertension Mother   . Heart disease Father     died at 73 from MI, he had first heart attack at 23  . Stroke Maternal Aunt   . Cancer Neg Hx   . Heart disease      died at 73 from heart attack (Brother's son)  . Heart attack Maternal Grandmother     No Known Allergies  Current Outpatient Prescriptions on File Prior to Visit  Medication Sig Dispense Refill  . Calcium Carbonate-Vitamin D (CALTRATE 600+D) 600-400 MG-UNIT per tablet Take 1 tablet by mouth 2 (two) times daily.    . cholecalciferol (VITAMIN D) 1000 UNITS tablet Take 3,000 Units by mouth daily.    . clotrimazole-betamethasone (LOTRISONE) cream APPLY TO THE AFFECTED AREA TWICE DAILY 30 g 1  . hydrochlorothiazide (HYDRODIURIL) 25 MG tablet Take 1 tablet (25 mg total) by mouth daily. 90 tablet 1  . omeprazole (PRILOSEC) 20 MG capsule TAKE 1 CAPSULE BY MOUTH DAILY 30 capsule 5  . simvastatin (ZOCOR) 20 MG tablet Take 1 tablet (20  mg total) by mouth at bedtime. 30 tablet 1  . timolol (BETIMOL) 0.25 % ophthalmic solution Place 1 drop into both eyes daily.     No current facility-administered medications on file prior to visit.    BP 140/90 mmHg  Pulse 89  Temp(Src) 98.2 F (36.8 C) (Oral)  Resp 16  Ht 5' 2.25" (1.581 m)  Wt 166 lb (75.297 kg)  BMI 30.12 kg/m2  SpO2 99%  LMP 02/05/1996    Objective:   Physical Exam  Constitutional: She is oriented to person, place, and time. She appears well-developed and well-nourished. No distress.  Cardiovascular: Normal rate and regular rhythm.   No murmur heard. Pulmonary/Chest: Effort normal and breath sounds normal. No respiratory distress. She has no wheezes. She has no rales. She exhibits no tenderness.  Neurological: She is alert and oriented to person, place, and time.  Psychiatric: She has a normal mood and affect. Her behavior is normal. Judgment and thought content normal.          Assessment & Plan:

## 2013-12-20 NOTE — Assessment & Plan Note (Signed)
White coat HTN, better readings at home.  Continue hctz, obtain bmet.

## 2013-12-20 NOTE — Assessment & Plan Note (Signed)
Obtain follow up A1C.   

## 2013-12-20 NOTE — Patient Instructions (Addendum)
Please schedule fasting lab work at the front desk.  Please schedule a follow up appointment in 3 months.

## 2013-12-21 NOTE — Telephone Encounter (Signed)
Pt was seen on 12/20/13 and refills were completed at that visit.

## 2013-12-27 ENCOUNTER — Other Ambulatory Visit (INDEPENDENT_AMBULATORY_CARE_PROVIDER_SITE_OTHER): Payer: Medicare Other

## 2013-12-27 DIAGNOSIS — E119 Type 2 diabetes mellitus without complications: Secondary | ICD-10-CM | POA: Diagnosis not present

## 2013-12-27 DIAGNOSIS — E785 Hyperlipidemia, unspecified: Secondary | ICD-10-CM

## 2013-12-27 LAB — HEPATIC FUNCTION PANEL
ALK PHOS: 87 U/L (ref 39–117)
ALT: 11 U/L (ref 0–35)
AST: 15 U/L (ref 0–37)
Albumin: 3.6 g/dL (ref 3.5–5.2)
BILIRUBIN DIRECT: 0 mg/dL (ref 0.0–0.3)
BILIRUBIN TOTAL: 0.2 mg/dL (ref 0.2–1.2)
Total Protein: 6.8 g/dL (ref 6.0–8.3)

## 2013-12-27 LAB — LIPID PANEL
Cholesterol: 210 mg/dL — ABNORMAL HIGH (ref 0–200)
HDL: 30.2 mg/dL — AB (ref >39.00)
LDL Cholesterol: 150 mg/dL — ABNORMAL HIGH (ref 0–99)
NonHDL: 179.8
Total CHOL/HDL Ratio: 7
Triglycerides: 148 mg/dL (ref 0.0–149.0)
VLDL: 29.6 mg/dL (ref 0.0–40.0)

## 2013-12-27 LAB — HEMOGLOBIN A1C: HEMOGLOBIN A1C: 6.5 % (ref 4.6–6.5)

## 2013-12-28 ENCOUNTER — Telehealth: Payer: Self-pay | Admitting: Family

## 2013-12-28 DIAGNOSIS — E785 Hyperlipidemia, unspecified: Secondary | ICD-10-CM

## 2013-12-28 NOTE — Telephone Encounter (Signed)
Attempted to reach pt. VOicemail has not been set up yet and unable to leave message.

## 2013-12-28 NOTE — Telephone Encounter (Signed)
Cholesterol is above goal. Start simvastatin, repeat flp in 6 weeks, dx hyperlipidemia.

## 2013-12-30 NOTE — Telephone Encounter (Signed)
Attempted to reach pt. Voicemail box has not been set up yet. Entered future lab order. Mailed letter to pt.

## 2014-03-28 ENCOUNTER — Ambulatory Visit: Payer: Medicare Other | Admitting: Family

## 2014-04-19 ENCOUNTER — Encounter: Payer: Self-pay | Admitting: Family

## 2014-04-19 ENCOUNTER — Ambulatory Visit (INDEPENDENT_AMBULATORY_CARE_PROVIDER_SITE_OTHER): Payer: PPO | Admitting: Family

## 2014-04-19 VITALS — BP 130/72 | HR 71 | Temp 98.4°F | Resp 16 | Ht 62.25 in | Wt 164.6 lb

## 2014-04-19 DIAGNOSIS — I10 Essential (primary) hypertension: Secondary | ICD-10-CM

## 2014-04-19 DIAGNOSIS — Z91018 Allergy to other foods: Secondary | ICD-10-CM

## 2014-04-19 DIAGNOSIS — E119 Type 2 diabetes mellitus without complications: Secondary | ICD-10-CM

## 2014-04-19 DIAGNOSIS — M79642 Pain in left hand: Secondary | ICD-10-CM

## 2014-04-19 DIAGNOSIS — E785 Hyperlipidemia, unspecified: Secondary | ICD-10-CM | POA: Diagnosis not present

## 2014-04-19 MED ORDER — MELOXICAM 7.5 MG PO TABS: 7.5000 mg | ORAL_TABLET | Freq: Every day | ORAL | Status: DC

## 2014-04-19 NOTE — Patient Instructions (Signed)
You may start meloxicam once daily as needed for left wrist/hand pain. Wear wrist brace as much as you are able to the next few weeks. Let us know if pain does not improve. You will be contact about your referral to the allergist.  Please let us know if you have not heard back within 1 week about your referral. Please complete lab work prior to leaving.

## 2014-04-19 NOTE — Progress Notes (Signed)
Subjective:    Patient ID: Jade Gross, female    DOB: Dec 18, 1945, 69 y.o.   MRN: [id number]  HPI  Pt presents today for follow up.  Patient presents today for follow up of multiple medical problems.  Diabetes Type 2  Pt is currently maintained on the following medications for diabetes:none  Lab Results  Component Value Date   HGBA1C 6.5 12/27/2013   HGBA1C 5.9* 06/15/2013   HGBA1C 5.8* 02/02/2013    Lab Results  Component Value Date   LDLCALC 150* 12/27/2013   CREATININE 0.81 06/15/2013   Denies polyuria/polydipsia. Denies hypoglycemia Home glucose readings range not checking  Hyperlipidemia  Patient is currently maintained on the following medication for hyperlipidemia: none (did not start simvastatin) Last lipid panel as follows:  Lab Results  Component Value Date   CHOL 210* 12/27/2013   HDL 30.20* 12/27/2013   LDLCALC 150* 12/27/2013   TRIG 148.0 12/27/2013   CHOLHDL 7 12/27/2013   Patient denies myalgia. Patient reports good compliance with low fat/low cholesterol diet.   Hypertension  Patient is currently maintained on the following medications for blood pressure: hctz Patient reports good compliance with blood pressure medications. Patient denies chest pain, shortness of breath or swelling. Last 3 blood pressure readings in our office are as follows: BP Readings from Last 3 Encounters:  04/19/14 130/72  12/20/13 140/90  06/15/13 148/88    Left middle finger pain x 1 month, + pain in the palm of the left hand and she has some dorsal hand "burning."   Reports that she continues to have skin itching despite changing detergent.  No improvement in these symptoms. Thinks that she had reaction to shrimp the other day (caused itching on arms/legs) did not have tongue/lip swelling. Denies hives   Review of Systems See HPI  Past Medical History  Diagnosis Date  . Hypertension   . Hyperlipidemia   . Borderline glaucoma     History   Social  History  . Marital Status: Divorced    Spouse Name: N/A  . Number of Children: 1  . Years of Education: N/A   Occupational History  .  [place]   Social History Main Topics  . Smoking status: Never Smoker   . Smokeless tobacco: Never Used  . Alcohol Use: No  . Drug Use: No  . Sexual Activity: Not on file   Other Topics Concern  . Not on file   Social History Narrative   Caffeine use:  <1 daily   Regular exercise:  No   Never smoked   Work at [employer] as a Librarian, academic in Press photographer   Divorced   She lives with roommate   Completed 12th grade          Past Surgical History  Procedure Laterality Date  . Cataract surgery, lens implant and retinal repain  2005    left eye  . Cataract surger/lens implant  2000    right eye  . Bunionectomy  ?2000    bunion removed from both feet    Family History  Problem Relation Age of Onset  . Heart disease Mother     Pacemaker at age 16  . Hypertension Mother   . Heart disease Father     died at 56 from MI, he had first heart attack at 107  . Stroke Maternal Aunt   . Cancer Neg Hx   . Heart disease      died at 39 from heart attack (Brother's son)  .  Heart attack Maternal Grandmother     No Known Allergies  Current Outpatient Prescriptions on File Prior to Visit  Medication Sig Dispense Refill  . Calcium Carbonate-Vitamin D (CALTRATE 600+D) 600-400 MG-UNIT per tablet Take 1 tablet by mouth 2 (two) times daily.    . clotrimazole-betamethasone (LOTRISONE) cream APPLY TO THE AFFECTED AREA TWICE DAILY 30 g 1  . hydrochlorothiazide (HYDRODIURIL) 25 MG tablet Take 1 tablet (25 mg total) by mouth daily. 90 tablet 1  . omeprazole (PRILOSEC) 20 MG capsule TAKE 1 CAPSULE BY MOUTH DAILY 30 capsule 5  . timolol (BETIMOL) 0.25 % ophthalmic solution Place 1 drop into both eyes daily.    . cholecalciferol (VITAMIN D) 1000 UNITS tablet Take 3,000 Units by mouth daily.    . simvastatin (ZOCOR) 20 MG tablet Take 1 tablet (20 mg total) by mouth  at bedtime. (Patient not taking: Reported on 04/19/2014) 30 tablet 1   No current facility-administered medications on file prior to visit.    BP 130/72 mmHg  Pulse 71  Temp(Src) 98.4 F (36.9 C) (Oral)  Resp 16  Ht 5' 2.25" (1.581 m)  Wt 164 lb 9.6 oz (74.662 kg)  BMI 29.87 kg/m2  SpO2 96%  LMP 02/05/1996       Objective:   Physical Exam  Constitutional: She is oriented to person, place, and time. She appears well-developed and well-nourished.  Cardiovascular: Normal rate, regular rhythm and normal heart sounds.   No murmur heard. Pulmonary/Chest: Effort normal and breath sounds normal. No respiratory distress. She has no wheezes.  Musculoskeletal: She exhibits no edema.  Left hand wrist without swelling.   Neurological: She is alert and oriented to person, place, and time.  Psychiatric: She has a normal mood and affect. Her behavior is normal. Judgment and thought content normal.          Assessment & Plan:

## 2014-04-19 NOTE — Progress Notes (Signed)
Pre visit review using our clinic review tool, if applicable. No additional management support is needed unless otherwise documented below in the visit note. 

## 2014-04-23 DIAGNOSIS — M79643 Pain in unspecified hand: Secondary | ICD-10-CM | POA: Insufficient documentation

## 2014-04-23 NOTE — Assessment & Plan Note (Addendum)
Continue diabetic diet, reinforced importance of exercise.  Obtain a1c.

## 2014-04-23 NOTE — Assessment & Plan Note (Signed)
Trial of meloxicam, and wrist brace, consider ortho referral if symptoms worsen or if symptoms do not improve.

## 2014-04-23 NOTE — Assessment & Plan Note (Signed)
BP stable on current meds continue same, obtain bmet.

## 2014-04-23 NOTE — Assessment & Plan Note (Signed)
Uncontrolled. Advised pt to start simvastatin.

## 2014-05-02 ENCOUNTER — Ambulatory Visit: Payer: Medicare Other | Admitting: Family

## 2014-05-05 ENCOUNTER — Telehealth: Payer: Self-pay | Admitting: Family

## 2014-05-05 MED ORDER — MELOXICAM 7.5 MG PO TABS: 7.5000 mg | ORAL_TABLET | Freq: Every day | ORAL | Status: DC

## 2014-05-05 NOTE — Telephone Encounter (Signed)
Caller name:[place] Aurie Relation to YD:SWVT Call back [phone] Pharmacy:[facility]  Reason for call: pt states she is completely out of her rx , would like to know if Melissa can send in a new rx   meloxicam (MOBIC) 7.5 MG tablet

## 2014-05-05 NOTE — Telephone Encounter (Signed)
Last rx given for #14 on 04/19/14. Do we need to increased quantity?

## 2014-06-29 ENCOUNTER — Other Ambulatory Visit: Payer: Self-pay | Admitting: Family

## 2014-06-29 NOTE — Telephone Encounter (Signed)
Simvastatin refill sent to pharmacy.  Pt will be due for 3 month f/u: diabetes and hyperlipidemia on or after 07/20/14.  Please call pt to arrange appointment.  Thanks!

## 2014-07-05 NOTE — Telephone Encounter (Signed)
Sent mychart message to schedule follow up

## 2014-07-05 NOTE — Telephone Encounter (Signed)
Could not leave message on pt voicemail advising pt of NP instruction

## 2014-07-17 ENCOUNTER — Other Ambulatory Visit: Payer: Self-pay | Admitting: Family

## 2014-07-17 NOTE — Telephone Encounter (Signed)
  Medication name:  Name from pharmacy:  meloxicam (MOBIC) 7.5 MG tablet MELOXICAM 7.5 MG TABLET 7.5 MG TAB     Sig: TAKE 1 TABLET (7.5 MG) BY MOUTH DAILY.    Dispense: 30 tablet   Refills: 1   Start: 07/17/2014   Class: Normal    Requested on: 05/05/2014    Originally ordered on: 04/19/2014 06/12/2014

## 2014-07-19 ENCOUNTER — Encounter: Payer: Self-pay | Admitting: *Deleted

## 2014-07-24 ENCOUNTER — Ambulatory Visit (INDEPENDENT_AMBULATORY_CARE_PROVIDER_SITE_OTHER): Payer: PPO | Admitting: Family

## 2014-07-24 ENCOUNTER — Encounter: Payer: Self-pay | Admitting: Family

## 2014-07-24 ENCOUNTER — Other Ambulatory Visit: Payer: Self-pay

## 2014-07-24 VITALS — BP 120/78 | HR 72 | Temp 98.2°F | Ht 63.5 in | Wt 162.2 lb

## 2014-07-24 DIAGNOSIS — E559 Vitamin D deficiency, unspecified: Secondary | ICD-10-CM | POA: Diagnosis not present

## 2014-07-24 DIAGNOSIS — I10 Essential (primary) hypertension: Secondary | ICD-10-CM

## 2014-07-24 DIAGNOSIS — E785 Hyperlipidemia, unspecified: Secondary | ICD-10-CM

## 2014-07-24 DIAGNOSIS — R2232 Localized swelling, mass and lump, left upper limb: Secondary | ICD-10-CM | POA: Diagnosis not present

## 2014-07-24 DIAGNOSIS — E119 Type 2 diabetes mellitus without complications: Secondary | ICD-10-CM | POA: Diagnosis not present

## 2014-07-24 LAB — BASIC METABOLIC PANEL
BUN: 24 mg/dL — ABNORMAL HIGH (ref 6–23)
CALCIUM: 9.4 mg/dL (ref 8.4–10.5)
CHLORIDE: 99 meq/L (ref 96–112)
CO2: 30 meq/L (ref 19–32)
CREATININE: 0.91 mg/dL (ref 0.40–1.20)
GFR: 65.26 mL/min (ref >60.00)
GLUCOSE: 104 mg/dL — AB (ref 70–99)
Potassium: 3.4 mEq/L — ABNORMAL LOW (ref 3.5–5.1)
SODIUM: 136 meq/L (ref 135–145)

## 2014-07-24 LAB — VITAMIN D 25 HYDROXY (VIT D DEFICIENCY, FRACTURES): VITD: 27.29 ng/mL — ABNORMAL LOW (ref 30.00–100.00)

## 2014-07-24 LAB — HEMOGLOBIN A1C: Hgb A1c MFr Bld: 6.2 % (ref 4.6–6.5)

## 2014-07-24 NOTE — Assessment & Plan Note (Signed)
Obtain vit d level, continue supplement.

## 2014-07-24 NOTE — Assessment & Plan Note (Signed)
Obtain follow up FLP, continue statin.

## 2014-07-24 NOTE — Patient Instructions (Signed)
Please complete lab work prior to leaving. You will be contacted about your referral to the hand specialist. Follow up in 3 months.

## 2014-07-24 NOTE — Progress Notes (Signed)
Pre visit review using our clinic review tool, if applicable. No additional management support is needed unless otherwise documented below in the visit note. 

## 2014-07-24 NOTE — Assessment & Plan Note (Addendum)
Stable on current meds. Continue same, obtain bmet.  

## 2014-07-24 NOTE — Progress Notes (Signed)
Subjective:    Patient ID: Jade Gross, female    DOB: 1945-03-15, 69 y.o.   MRN: [id number]  HPI  Jade Gross is a 69 yr old female who presents today for 3 month follow up.   Patient presents today for follow up of multiple medical problems.  Diabetes Type 2  Pt is currently maintained on the following medications for diabetes:none  Lab Results  Component Value Date   HGBA1C 6.5 12/27/2013   HGBA1C 5.9* 06/15/2013   HGBA1C 5.8* 02/02/2013    Lab Results  Component Value Date   LDLCALC 150* 12/27/2013   CREATININE 0.81 06/15/2013    Last diabetic eye exam was: 1/16- has pressure checked every 6 weeks.  Denies polyuria/polydipsia. Home glucose readings range: not checking  Hyperlipidemia  Patient is currently maintained on the following medication for hyperlipidemia: simvastatin 20mg  Last lipid panel as follows:  Lab Results  Component Value Date   CHOL 210* 12/27/2013   HDL 30.20* 12/27/2013   LDLCALC 150* 12/27/2013   TRIG 148.0 12/27/2013   CHOLHDL 7 12/27/2013   Denies unusual myalgia Patient reports good compliance with low fat/low cholesterol diet.   Hypertension  Patient is currently maintained on the following medications for blood pressure: hctz Patient reports good compliance with blood pressure medications. Patient denies chest pain, shortness of breath or swelling. Last 3 blood pressure readings in our office are as follows: BP Readings from Last 3 Encounters:  07/24/14 120/78  04/19/14 130/72  12/20/13 140/90   Vit D deficiency-  Maintained on vit D 3000 units once daily   Review of Systems    see HPI  Past Medical History  Diagnosis Date  . Hypertension   . Hyperlipidemia   . Borderline glaucoma     History   Social History  . Marital Status: Divorced    Spouse Name: N/A  . Number of Children: 1  . Years of Education: N/A   Occupational History  .  [place]   Social History Main Topics  . Smoking status: Never Smoker     . Smokeless tobacco: Never Used  . Alcohol Use: No  . Drug Use: No  . Sexual Activity: Not on file   Other Topics Concern  . Not on file   Social History Narrative   Caffeine use:  <1 daily   Regular exercise:  No   Never smoked   Work at [employer] as a Librarian, academic in Press photographer   Divorced   She lives with roommate   Completed 12th grade          Past Surgical History  Procedure Laterality Date  . Cataract surgery, lens implant and retinal repain  2005    left eye  . Cataract surger/lens implant  2000    right eye  . Bunionectomy  ?2000    bunion removed from both feet    Family History  Problem Relation Age of Onset  . Heart disease Mother     Pacemaker at age 19  . Hypertension Mother   . Heart disease Father     died at 75 from MI, he had first heart attack at 24  . Stroke Maternal Aunt   . Cancer Neg Hx   . Heart disease      died at 68 from heart attack (Brother's son)  . Heart attack Maternal Grandmother     No Known Allergies  Current Outpatient Prescriptions on File Prior to Visit  Medication Sig Dispense Refill  .  Calcium Carbonate-Vitamin D (CALTRATE 600+D) 600-400 MG-UNIT per tablet Take 1 tablet by mouth 2 (two) times daily.    . cholecalciferol (VITAMIN D) 1000 UNITS tablet Take 3,000 Units by mouth daily.    . clotrimazole-betamethasone (LOTRISONE) cream APPLY TO THE AFFECTED AREA TWICE DAILY 30 g 1  . hydrochlorothiazide (HYDRODIURIL) 25 MG tablet Take 1 tablet (25 mg total) by mouth daily. 90 tablet 1  . meloxicam (MOBIC) 7.5 MG tablet TAKE 1 TABLET (7.5 MG) BY MOUTH DAILY. 30 tablet 1  . omeprazole (PRILOSEC) 20 MG capsule TAKE 1 CAPSULE BY MOUTH DAILY 30 capsule 5  . simvastatin (ZOCOR) 20 MG tablet TAKE 1 TABLET BY MOUTH EVERY NIGHT AT BEDTIME 30 tablet 1  . timolol (BETIMOL) 0.25 % ophthalmic solution Place 1 drop into both eyes daily.     No current facility-administered medications on file prior to visit.    BP 120/78 mmHg  Pulse 72   Temp(Src) 98.2 F (36.8 C) (Oral)  Ht 5' 3.5" (1.613 m)  Wt 162 lb 4 oz (73.596 kg)  BMI 28.29 kg/m2  SpO2 97%  LMP 02/05/1996    Objective:   Physical Exam  Constitutional: She is oriented to person, place, and time. She appears well-developed and well-nourished.  Cardiovascular: Normal rate, regular rhythm and normal heart sounds.   No murmur heard. Pulmonary/Chest: Effort normal and breath sounds normal. No respiratory distress. She has no wheezes.  Musculoskeletal: She exhibits no edema.  Left middle finger- two cystic nodules noted left middle finger DIP- appear enlarged since last visit  Neurological: She is alert and oriented to person, place, and time.  Skin: Skin is warm and dry.  Psychiatric: She has a normal mood and affect. Her behavior is normal. Judgment and thought content normal.          Assessment & Plan:

## 2014-07-24 NOTE — Assessment & Plan Note (Signed)
Obtain a1c.  

## 2014-07-24 NOTE — Assessment & Plan Note (Signed)
Enlarging. Will refer to hand surgeon for further evaluation.

## 2014-07-26 ENCOUNTER — Other Ambulatory Visit: Payer: Self-pay | Admitting: Family

## 2014-07-26 DIAGNOSIS — E559 Vitamin D deficiency, unspecified: Secondary | ICD-10-CM

## 2014-07-26 DIAGNOSIS — E876 Hypokalemia: Secondary | ICD-10-CM

## 2014-07-26 NOTE — Telephone Encounter (Signed)
Vitamin D level is low.  Advise patient to begin vit D 50000 units once weekly for 12 weeks, then repeat vit D level (dx Vit D deficiency).    Also, potassium is low, add kdur once daily, repeat bmet in 1 week, dx hypokalemia.

## 2014-07-27 NOTE — Telephone Encounter (Signed)
I would also rec that she add aspirin 81mg  once daily for heart protection.

## 2014-07-27 NOTE — Telephone Encounter (Signed)
Attempted to call patient regarding lab results and medications and left message for patient to return call when available.

## 2014-08-01 MED ORDER — VITAMIN D (ERGOCALCIFEROL) 1.25 MG (50000 UNIT) PO CAPS: 50000.0000 [IU] | ORAL_CAPSULE | ORAL | Status: DC

## 2014-08-01 MED ORDER — POTASSIUM CHLORIDE CRYS ER 20 MEQ PO TBCR: 20.0000 meq | EXTENDED_RELEASE_TABLET | Freq: Every day | ORAL | Status: DC

## 2014-08-01 NOTE — Telephone Encounter (Signed)
Left detailed message on pt's home # re: below instructions and to call and schedule lab appts as indicated below.  Future lab orders entered.

## 2014-08-09 ENCOUNTER — Other Ambulatory Visit: Payer: Self-pay | Admitting: Family

## 2014-08-25 ENCOUNTER — Other Ambulatory Visit: Payer: Self-pay | Admitting: Family

## 2014-09-25 ENCOUNTER — Other Ambulatory Visit: Payer: Self-pay | Admitting: Family

## 2014-09-25 NOTE — Telephone Encounter (Signed)
Pt is due for 3 month follow up after 10/24/14.  Please call pt to arrange appt. With Melissa.  Thanks!

## 2014-10-09 ENCOUNTER — Other Ambulatory Visit: Payer: Self-pay | Admitting: Orthopedic Surgery

## 2014-12-06 ENCOUNTER — Other Ambulatory Visit: Payer: Self-pay | Admitting: Family

## 2014-12-06 NOTE — Telephone Encounter (Signed)
LM for pt to call in and schedule f/u appt

## 2014-12-06 NOTE — Telephone Encounter (Signed)
30 day supply of meloxicam and atorvastatin sent to pharmacy. Pt was due for follow up in September and is past due.  Please call pt to arrange follow up before further refills are due.  Thanks!

## 2015-01-08 ENCOUNTER — Telehealth: Payer: Self-pay | Admitting: Family

## 2015-01-08 MED ORDER — SIMVASTATIN 20 MG PO TABS: 20.0000 mg | ORAL_TABLET | Freq: Every day | ORAL | Status: DC

## 2015-01-08 MED ORDER — MELOXICAM 7.5 MG PO TABS: ORAL_TABLET | ORAL | Status: DC

## 2015-01-08 NOTE — Telephone Encounter (Signed)
Relation to [id number] Call back number:[phone] Pharmacy: [facility] [place] [phone] (Phone) [phone] (Fax)         Reason for call:  Patient requesting a refill simvastatin (ZOCOR) 20 MG tablet and meloxicam (MOBIC) 7.5 MG tablet to hold her over until follow up appointment scheduled for 01/12/15

## 2015-01-08 NOTE — Telephone Encounter (Signed)
1 week supply sent to pharmacy. Notified pt.

## 2015-01-12 ENCOUNTER — Encounter: Payer: Self-pay | Admitting: Family

## 2015-01-12 ENCOUNTER — Other Ambulatory Visit: Payer: Self-pay | Admitting: Family

## 2015-01-12 ENCOUNTER — Ambulatory Visit (INDEPENDENT_AMBULATORY_CARE_PROVIDER_SITE_OTHER): Payer: PPO | Admitting: Family

## 2015-01-12 VITALS — HR 70 | Temp 98.1°F | Resp 18 | Ht 63.5 in | Wt 156.8 lb

## 2015-01-12 DIAGNOSIS — Z1239 Encounter for other screening for malignant neoplasm of breast: Secondary | ICD-10-CM

## 2015-01-12 DIAGNOSIS — E119 Type 2 diabetes mellitus without complications: Secondary | ICD-10-CM

## 2015-01-12 DIAGNOSIS — R2232 Localized swelling, mass and lump, left upper limb: Secondary | ICD-10-CM

## 2015-01-12 DIAGNOSIS — E785 Hyperlipidemia, unspecified: Secondary | ICD-10-CM | POA: Diagnosis not present

## 2015-01-12 DIAGNOSIS — I10 Essential (primary) hypertension: Secondary | ICD-10-CM

## 2015-01-12 DIAGNOSIS — Z1231 Encounter for screening mammogram for malignant neoplasm of breast: Secondary | ICD-10-CM

## 2015-01-12 LAB — LIPID PANEL
CHOL/HDL RATIO: 5
CHOLESTEROL: 183 mg/dL (ref 0–200)
HDL: 39.6 mg/dL (ref >39.00)
LDL CALC: 110 mg/dL — AB (ref 0–99)
NonHDL: 143.04
TRIGLYCERIDES: 165 mg/dL — AB (ref 0.0–149.0)
VLDL: 33 mg/dL (ref 0.0–40.0)

## 2015-01-12 LAB — BASIC METABOLIC PANEL
BUN: 23 mg/dL (ref 6–23)
CALCIUM: 10.3 mg/dL (ref 8.4–10.5)
CO2: 32 meq/L (ref 19–32)
CREATININE: 1.01 mg/dL (ref 0.40–1.20)
Chloride: 99 mEq/L (ref 96–112)
GFR: 57.78 mL/min — AB (ref >60.00)
GLUCOSE: 96 mg/dL (ref 70–99)
Potassium: 4.1 mEq/L (ref 3.5–5.1)
Sodium: 138 mEq/L (ref 135–145)

## 2015-01-12 LAB — MICROALBUMIN / CREATININE URINE RATIO
CREATININE, U: 104.4 mg/dL
Microalb Creat Ratio: 0.7 mg/g (ref 0.0–30.0)
Microalb, Ur: <0.7 mg/dL (ref 0.0–1.9)

## 2015-01-12 LAB — HEMOGLOBIN A1C: Hgb A1c MFr Bld: 6.4 % (ref 4.6–6.5)

## 2015-01-12 MED ORDER — HYDROCHLOROTHIAZIDE 25 MG PO TABS: ORAL_TABLET | ORAL | Status: DC

## 2015-01-12 MED ORDER — SIMVASTATIN 20 MG PO TABS: 20.0000 mg | ORAL_TABLET | Freq: Every day | ORAL | Status: DC

## 2015-01-12 MED ORDER — SIMVASTATIN 40 MG PO TABS: 40.0000 mg | ORAL_TABLET | Freq: Every day | ORAL | Status: DC

## 2015-01-12 MED ORDER — POTASSIUM CHLORIDE CRYS ER 20 MEQ PO TBCR: 20.0000 meq | EXTENDED_RELEASE_TABLET | Freq: Every day | ORAL | Status: DC

## 2015-01-12 MED ORDER — MELOXICAM 7.5 MG PO TABS: ORAL_TABLET | ORAL | Status: DC

## 2015-01-12 NOTE — Assessment & Plan Note (Signed)
?   Keloid at site of nodule. Advised pt to arrange follow up with hand surgeon

## 2015-01-12 NOTE — Assessment & Plan Note (Signed)
On statin, obtain flp

## 2015-01-12 NOTE — Assessment & Plan Note (Signed)
Clinically stable.  Obtain A1C, bmet, urine microalbumin. Diabetic diet.

## 2015-01-12 NOTE — Progress Notes (Signed)
Subjective:    Patient ID: Jade Gross, female    DOB: [birth year], 69 y.o.   MRN: [id number]  HPI   Jade Gross is a 69 yr old female who presents today for follow up.  1) DM2- does not check sugars. Reports that she does OK with diet.   Lab Results  Component Value Date   HGBA1C 6.2 07/24/2014   HGBA1C 6.5 12/27/2013   HGBA1C 5.9* 06/15/2013   Lab Results  Component Value Date   LDLCALC 150* 12/27/2013   CREATININE 0.91 07/24/2014   2) HTN_ maintained on hctz BP Readings from Last 3 Encounters:  07/24/14 120/78  04/19/14 130/72  12/20/13 140/90   3) Hyperlipidemia- on simvastatin.  Denies myalgia.    4) L finger- had surgery 9/16 with [facility].  Still has a knot.      Review of Systems  Respiratory: Negative for cough and shortness of breath.   Cardiovascular: Negative for chest pain.       Past Medical History  Diagnosis Date  . Hypertension   . Hyperlipidemia   . Borderline glaucoma     Social History   Social History  . Marital Status: Divorced    Spouse Name: N/A  . Number of Children: 1  . Years of Education: N/A   Occupational History  .  [place]   Social History Main Topics  . Smoking status: Never Smoker   . Smokeless tobacco: Never Used  . Alcohol Use: No  . Drug Use: No  . Sexual Activity: Not on file   Other Topics Concern  . Not on file   Social History Narrative   Caffeine use:  <1 daily   Regular exercise:  No   Never smoked   Work at [employer] as a Librarian, academic in Press photographer   Divorced   She lives with roommate   Completed 12th grade          Past Surgical History  Procedure Laterality Date  . Cataract surgery, lens implant and retinal repain  2005    left eye  . Cataract surger/lens implant  2000    right eye  . Bunionectomy  ?2000    bunion removed from both feet    Family History  Problem Relation Age of Onset  . Heart disease Mother     Pacemaker at age 18  . Hypertension Mother   . Heart disease Father     died at 50 from MI, he had first heart attack at 64  . Stroke Maternal Aunt   . Cancer Neg Hx   . Heart disease      died at 23 from heart attack (Brother's son)  . Heart attack Maternal Grandmother     No Known Allergies  Current Outpatient Prescriptions on File Prior to Visit  Medication Sig Dispense Refill  . aspirin 81 MG tablet Take 81 mg by mouth daily.    . Calcium Carbonate-Vitamin D (CALTRATE 600+D) 600-400 MG-UNIT per tablet Take 1 tablet by mouth 2 (two) times daily.    . cholecalciferol (VITAMIN D) 1000 UNITS tablet Take 3,000 Units by mouth daily.    . clotrimazole-betamethasone (LOTRISONE) cream APPLY TO THE AFFECTED AREA TWICE DAILY 30 g 1  . meloxicam (MOBIC) 7.5 MG tablet TAKE 1 TABLET (7.5 MG) BY MOUTH DAILY. 7 tablet 0  . omeprazole (PRILOSEC) 20 MG capsule TAKE 1 CAPSULE BY MOUTH DAILY 90 capsule 1  . timolol (BETIMOL) 0.25 % ophthalmic solution Place 1 drop  into both eyes daily.     No current facility-administered medications on file prior to visit.    Pulse 70  Temp(Src) 98.1 F (36.7 C) (Oral)  Resp 18  Ht 5' 3.5" (1.613 m)  Wt 156 lb 12.8 oz (71.124 kg)  BMI 27.34 kg/m2  SpO2 99%  LMP 02/05/1996    Objective:   Physical Exam  Constitutional: She is oriented to person, place, and time. She appears well-developed and well-nourished.  HENT:  Head: Normocephalic and atraumatic.  Cardiovascular: Normal rate, regular rhythm and normal heart sounds.   No murmur heard. Pulmonary/Chest: Effort normal and breath sounds normal. No respiratory distress. She has no wheezes.  Musculoskeletal: She exhibits no edema.  Neurological: She is alert and oriented to person, place, and time.  Skin:  + scarring noted at base of the left middle finger nailbed  Psychiatric: She has a normal mood and affect. Her behavior is normal. Judgment and thought content normal.          Assessment & Plan:

## 2015-01-12 NOTE — Assessment & Plan Note (Signed)
bp stable on current meds.  Obtain bmet. 

## 2015-01-12 NOTE — Addendum Note (Signed)
Addended by: Kelle Darting A on: 01/12/2015 03:51 PM   Modules accepted: Orders

## 2015-01-12 NOTE — Patient Instructions (Signed)
Please complete lab work prior to leaving.   

## 2015-01-12 NOTE — Progress Notes (Signed)
Pre visit review using our clinic review tool, if applicable. No additional management support is needed unless otherwise documented below in the visit note. 

## 2015-02-16 DIAGNOSIS — H40053 Ocular hypertension, bilateral: Secondary | ICD-10-CM | POA: Diagnosis not present

## 2015-02-16 DIAGNOSIS — H43813 Vitreous degeneration, bilateral: Secondary | ICD-10-CM | POA: Diagnosis not present

## 2015-02-23 ENCOUNTER — Other Ambulatory Visit: Payer: Self-pay | Admitting: Family

## 2015-02-23 MED FILL — SIMVASTATIN 40 MG TABLET: 40 | 30 days supply | Qty: 30 | Fill #1

## 2015-02-23 MED FILL — MELOXICAM 7.5 MG TABLET: 7.5 | 30 days supply | Qty: 30 | Fill #0

## 2015-02-23 MED FILL — TIMOLOL 0.25% EYE DROPS: 0.25 | 90 days supply | Qty: 10 | Fill #1

## 2015-02-23 NOTE — Telephone Encounter (Signed)
Requesting rx for meloxicam and patient was last seen 01-12-15

## 2015-02-23 NOTE — Telephone Encounter (Deleted)
Pt was last seen 01/12/15 for meloxicam can you please advise?

## 2015-03-28 ENCOUNTER — Other Ambulatory Visit: Payer: Self-pay | Admitting: Family

## 2015-03-28 MED FILL — OMEPRAZOLE DR 20 MG CAPSULE: 20 | 90 days supply | Qty: 90 | Fill #0

## 2015-03-28 MED FILL — SIMVASTATIN 40 MG TABLET: 40 | 30 days supply | Qty: 30 | Fill #2

## 2015-03-28 NOTE — Telephone Encounter (Signed)
Jade Gross-- please advise Jade Gross request?

## 2015-03-29 MED FILL — MELOXICAM 7.5 MG TABLET: 7.5 | 30 days supply | Qty: 30 | Fill #0

## 2015-05-08 ENCOUNTER — Telehealth: Payer: Self-pay | Admitting: Family

## 2015-05-08 NOTE — Telephone Encounter (Signed)
Relation to [id number] Call back number:[phone] Pharmacy: [facility], [place] [phone] (Phone) [phone] (Fax)         Reason for call:  Patient requesting a refill meloxicam (MOBIC) 7.5 MG tablet to hold her over until follow up scheduled for 05/23/15 with PCP.

## 2015-05-08 NOTE — Telephone Encounter (Signed)
Noted in chart on last fill by [facility]  that she needed appointment for further refills. 30-day supply given at that time. She will need to be seen before refill or will have to wait for Melissa to address.

## 2015-05-08 NOTE — Telephone Encounter (Signed)
Please advise 

## 2015-05-09 ENCOUNTER — Other Ambulatory Visit: Payer: Self-pay | Admitting: Family

## 2015-05-09 MED FILL — SIMVASTATIN 40 MG TABLET: 40 | 30 days supply | Qty: 30 | Fill #0

## 2015-05-09 MED FILL — MELOXICAM 7.5 MG TABLET: 7.5 | 30 days supply | Qty: 30 | Fill #0

## 2015-05-23 ENCOUNTER — Ambulatory Visit (INDEPENDENT_AMBULATORY_CARE_PROVIDER_SITE_OTHER): Payer: PPO | Admitting: Family

## 2015-05-23 ENCOUNTER — Encounter: Payer: Self-pay | Admitting: Family

## 2015-05-23 VITALS — BP 146/80 | HR 66 | Temp 98.3°F | Resp 16 | Ht 63.5 in | Wt 152.2 lb

## 2015-05-23 DIAGNOSIS — E119 Type 2 diabetes mellitus without complications: Secondary | ICD-10-CM | POA: Diagnosis not present

## 2015-05-23 DIAGNOSIS — E785 Hyperlipidemia, unspecified: Secondary | ICD-10-CM | POA: Diagnosis not present

## 2015-05-23 DIAGNOSIS — I10 Essential (primary) hypertension: Secondary | ICD-10-CM | POA: Diagnosis not present

## 2015-05-23 NOTE — Assessment & Plan Note (Signed)
Fair BP.  Continue hctz, advised pt to return in 1 month for nurse visit BP recheck. If still elevated, consider adjustment of BP meds.

## 2015-05-23 NOTE — Assessment & Plan Note (Signed)
Stable on diet alone. Monitor.

## 2015-05-23 NOTE — Assessment & Plan Note (Signed)
LDL at goal on statin, continue same. Trigs were a bit elevated last visit.  Plan FLP next visit.

## 2015-05-23 NOTE — Patient Instructions (Signed)
Follow up in 6 weeks for nurse visit BP check. Follow up in 3 months for office visit.

## 2015-05-23 NOTE — Progress Notes (Signed)
Subjective:    Patient ID: Jade Gross, female    DOB: 10-26-1945, 70 y.o.   MRN: [id number]  HPI  Jade Gross is a 70 yr old female who presents today for follow up.  1) DM2- diet controlled.  Lab Results  Component Value Date   HGBA1C 6.4 01/12/2015   HGBA1C 6.2 07/24/2014   HGBA1C 6.5 12/27/2013   Lab Results  Component Value Date   MICROALBUR <0.7 01/12/2015   LDLCALC 110* 01/12/2015   CREATININE 1.01 01/12/2015   2) HTN- maintained on hctz and Kdur. Reports good compliance.   BP Readings from Last 3 Encounters:  05/23/15 146/80  07/24/14 120/78  04/19/14 130/72   3) Hyperlipidemia- on zocor 40mg .  Lab Results  Component Value Date   CHOL 183 01/12/2015   HDL 39.60 01/12/2015   LDLCALC 110* 01/12/2015   TRIG 165.0* 01/12/2015   CHOLHDL 5 01/12/2015   Reports stress in caring for brother who has lymphoma.      Review of Systems    see HPI Past Medical History  Diagnosis Date  . Hypertension   . Hyperlipidemia   . Borderline glaucoma      Social History   Social History  . Marital Status: Divorced    Spouse Name: N/A  . Number of Children: 1  . Years of Education: N/A   Occupational History  .  [place]   Social History Main Topics  . Smoking status: Never Smoker   . Smokeless tobacco: Never Used  . Alcohol Use: No  . Drug Use: No  . Sexual Activity: Not on file   Other Topics Concern  . Not on file   Social History Narrative   Caffeine use:  <1 daily   Regular exercise:  No   Never smoked   Work at [employer] as a Librarian, academic in Press photographer   Divorced   She lives with roommate   Completed 12th grade          Past Surgical History  Procedure Laterality Date  . Cataract surgery, lens implant and retinal repain  2005    left eye  . Cataract surger/lens implant  2000    right eye  . Bunionectomy  ?2000    bunion removed from both feet    Family History  Problem Relation Age of Onset  . Heart disease Mother     Pacemaker at age  40  . Hypertension Mother   . Heart disease Father     died at 33 from MI, he had first heart attack at 25  . Stroke Maternal Aunt   . Heart disease      died at 30 from heart attack (Brother's son)  . Heart attack Maternal Grandmother   . Cancer Brother     lymphoma    No Known Allergies  Current Outpatient Prescriptions on File Prior to Visit  Medication Sig Dispense Refill  . aspirin 81 MG tablet Take 81 mg by mouth daily.    . Calcium Carbonate-Vitamin D (CALTRATE 600+D) 600-400 MG-UNIT per tablet Take 1 tablet by mouth 2 (two) times daily.    . cholecalciferol (VITAMIN D) 1000 UNITS tablet Take 3,000 Units by mouth daily.    . clotrimazole-betamethasone (LOTRISONE) cream APPLY TO THE AFFECTED AREA TWICE DAILY 30 g 1  . hydrochlorothiazide (HYDRODIURIL) 25 MG tablet TAKE 1 TABLET (25 MG TOTAL) BY MOUTH DAILY. 90 tablet 1  . meloxicam (MOBIC) 7.5 MG tablet TAKE 1 TABLET (7.5 MG) BY  MOUTH DAILY **NEED TO BE SEEN FOR FURTHER REFILLS** 30 tablet 0  . omeprazole (PRILOSEC) 20 MG capsule TAKE 1 CAPSULE BY MOUTH DAILY 90 capsule 1  . potassium chloride SA (K-DUR,KLOR-CON) 20 MEQ tablet Take 1 tablet (20 mEq total) by mouth daily. 90 tablet 1  . simvastatin (ZOCOR) 40 MG tablet TAKE 1 TABLET (40 MG TOTAL) BY MOUTH AT BEDTIME. 30 tablet 5  . timolol (BETIMOL) 0.25 % ophthalmic solution Place 1 drop into both eyes daily.    . vitamin C (ASCORBIC ACID) 500 MG tablet Take 500 mg by mouth every morning.     No current facility-administered medications on file prior to visit.    BP 146/80 mmHg  Pulse 66  Temp(Src) 98.3 F (36.8 C) (Oral)  Resp 16  Ht 5' 3.5" (1.613 m)  Wt 152 lb 3.2 oz (69.037 kg)  BMI 26.53 kg/m2  SpO2 100%  LMP 02/05/1996    Objective:   Physical Exam  Constitutional: She is oriented to person, place, and time. She appears well-developed and well-nourished.  Cardiovascular: Normal rate, regular rhythm and normal heart sounds.   No murmur  heard. Pulmonary/Chest: Effort normal and breath sounds normal. No respiratory distress. She has no wheezes.  Musculoskeletal: She exhibits no edema.  Neurological: She is alert and oriented to person, place, and time.  Psychiatric: She has a normal mood and affect. Her behavior is normal. Judgment and thought content normal.          Assessment & Plan:

## 2015-05-23 NOTE — Progress Notes (Signed)
Pre visit review using our clinic review tool, if applicable. No additional management support is needed unless otherwise documented below in the visit note. 

## 2015-06-12 ENCOUNTER — Other Ambulatory Visit: Payer: Self-pay | Admitting: Family

## 2015-06-12 MED FILL — SIMVASTATIN 40 MG TABLET: 40 | 30 days supply | Qty: 30 | Fill #1

## 2015-06-12 MED FILL — TIMOLOL 0.25% EYE DROPS: 0.25 | 90 days supply | Qty: 10 | Fill #2

## 2015-06-13 MED FILL — MELOXICAM 7.5 MG TABLET: 7.5 | 30 days supply | Qty: 30 | Fill #0

## 2015-07-03 ENCOUNTER — Encounter: Payer: Self-pay | Admitting: Family

## 2015-07-03 ENCOUNTER — Ambulatory Visit (INDEPENDENT_AMBULATORY_CARE_PROVIDER_SITE_OTHER): Payer: PPO | Admitting: Family

## 2015-07-03 VITALS — BP 118/86 | HR 72

## 2015-07-03 DIAGNOSIS — I10 Essential (primary) hypertension: Secondary | ICD-10-CM | POA: Diagnosis not present

## 2015-07-03 LAB — BASIC METABOLIC PANEL
BUN: 23 mg/dL (ref 6–23)
CALCIUM: 9.8 mg/dL (ref 8.4–10.5)
CO2: 30 meq/L (ref 19–32)
CREATININE: 1.19 mg/dL (ref 0.40–1.20)
Chloride: 101 mEq/L (ref 96–112)
GFR: 47.75 mL/min — AB (ref >60.00)
GLUCOSE: 103 mg/dL — AB (ref 70–99)
Potassium: 3.9 mEq/L (ref 3.5–5.1)
SODIUM: 139 meq/L (ref 135–145)

## 2015-07-03 NOTE — Progress Notes (Signed)
Noted and agree. 

## 2015-07-03 NOTE — Progress Notes (Signed)
Pre visit review using our clinic review tool, if applicable. No additional management support is needed unless otherwise documented below in the visit note.  Per 05/23/15 OV note: advised pt to return in 1 month for nurse visit BP recheck  Per Debbrah Alar: BMET today. Follow-up in 6 months or as scheduled.  Next appt: 08/22/15  Pt verbalized understanding of instructions.   Dorrene German, RN

## 2015-07-18 ENCOUNTER — Other Ambulatory Visit: Payer: Self-pay | Admitting: Family

## 2015-07-18 MED FILL — HYDROCHLOROTHIAZIDE 25 MG T: 25 | 90 days supply | Qty: 90 | Fill #1

## 2015-07-18 MED FILL — SIMVASTATIN 40 MG TABLET: 40 | 30 days supply | Qty: 30 | Fill #2

## 2015-07-20 MED FILL — MELOXICAM 7.5 MG TABLET: 7.5 | 30 days supply | Qty: 30 | Fill #0

## 2015-07-26 MED FILL — OMEPRAZOLE DR 20 MG CAPSULE: 20 | 90 days supply | Qty: 90 | Fill #1

## 2015-08-15 LAB — HM DIABETES EYE EXAM

## 2015-08-17 DIAGNOSIS — H40059 Ocular hypertension, unspecified eye: Secondary | ICD-10-CM | POA: Diagnosis not present

## 2015-08-22 ENCOUNTER — Ambulatory Visit (INDEPENDENT_AMBULATORY_CARE_PROVIDER_SITE_OTHER): Payer: PPO | Admitting: Family

## 2015-08-22 VITALS — BP 128/80 | HR 67 | Temp 98.3°F | Resp 16 | Ht 64.0 in | Wt 149.0 lb

## 2015-08-22 DIAGNOSIS — E785 Hyperlipidemia, unspecified: Secondary | ICD-10-CM

## 2015-08-22 DIAGNOSIS — I10 Essential (primary) hypertension: Secondary | ICD-10-CM

## 2015-08-22 DIAGNOSIS — Z1239 Encounter for other screening for malignant neoplasm of breast: Secondary | ICD-10-CM

## 2015-08-22 DIAGNOSIS — E119 Type 2 diabetes mellitus without complications: Secondary | ICD-10-CM | POA: Diagnosis not present

## 2015-08-22 LAB — LIPID PANEL
CHOL/HDL RATIO: 4
CHOLESTEROL: 170 mg/dL (ref 0–200)
HDL: 38.9 mg/dL — AB (ref >39.00)
LDL CALC: 92 mg/dL (ref 0–99)
NonHDL: 131.55
TRIGLYCERIDES: 198 mg/dL — AB (ref 0.0–149.0)
VLDL: 39.6 mg/dL (ref 0.0–40.0)

## 2015-08-22 LAB — HEMOGLOBIN A1C: Hgb A1c MFr Bld: 6.4 % (ref 4.6–6.5)

## 2015-08-22 MED ORDER — HYDROCHLOROTHIAZIDE 25 MG PO TABS: ORAL_TABLET | ORAL | 1 refills | Status: DC

## 2015-08-22 MED ORDER — POTASSIUM CHLORIDE CRYS ER 20 MEQ PO TBCR: 20.0000 meq | EXTENDED_RELEASE_TABLET | Freq: Every day | ORAL | 1 refills | Status: DC

## 2015-08-22 NOTE — Assessment & Plan Note (Signed)
Controlled with diet 

## 2015-08-22 NOTE — Patient Instructions (Signed)
Please complete lab work prior to leaving. Schedule mammogram on the first floor.  

## 2015-08-22 NOTE — Progress Notes (Signed)
Pre visit review using our clinic review tool, if applicable. No additional management support is needed unless otherwise documented below in the visit note. 

## 2015-08-22 NOTE — Progress Notes (Signed)
Subjective:    Patient ID: Jade Gross, female    DOB: 1945/06/10, 70 y.o.   MRN: [id number]  HPI  Jade Gross is a 70 yr old female who presents today for follow up.  1) HTN- maintained on hctz 25mg .  BP Readings from Last 3 Encounters:  08/22/15 128/80  07/03/15 118/86  05/23/15 (!) 146/80   2) Hyperlipidemia- maintained on simvastatin.  Lab Results  Component Value Date   CHOL 183 01/12/2015   HDL 39.60 01/12/2015   LDLCALC 110 (H) 01/12/2015   TRIG 165.0 (H) 01/12/2015   CHOLHDL 5 01/12/2015   3) DM2- on diet alone. Lab Results  Component Value Date   HGBA1C 6.4 01/12/2015   HGBA1C 6.2 07/24/2014   HGBA1C 6.5 12/27/2013   Lab Results  Component Value Date   MICROALBUR <0.7 01/12/2015   LDLCALC 110 (H) 01/12/2015   CREATININE 1.19 07/03/2015     Review of Systems  Respiratory: Negative for shortness of breath.   Cardiovascular: Negative for chest pain and leg swelling.  Musculoskeletal: Negative for myalgias.   Past Medical History:  Diagnosis Date  . Borderline glaucoma   . Hyperlipidemia   . Hypertension      Social History   Social History  . Marital status: Divorced    Spouse name: N/A  . Number of children: 1  . Years of education: N/A   Occupational History  .  [place]   Social History Main Topics  . Smoking status: Never Smoker  . Smokeless tobacco: Never Used  . Alcohol use No  . Drug use: No  . Sexual activity: Not on file   Other Topics Concern  . Not on file   Social History Narrative   Caffeine use:  <1 daily   Regular exercise:  No   Never smoked   Work at [employer] as a Librarian, academic in Press photographer   Divorced   She lives with roommate   Completed 12th grade          Past Surgical History:  Procedure Laterality Date  . BUNIONECTOMY  ?2000   bunion removed from both feet  . cataract surger/lens implant  2000   right eye  . Cataract surgery, lens implant and retinal repain  2005   left eye    Family History    Problem Relation Age of Onset  . Heart disease Mother     Pacemaker at age 24  . Hypertension Mother   . Heart disease Father     died at 44 from MI, he had first heart attack at 70  . Stroke Maternal Aunt   . Heart disease      died at 65 from heart attack (Brother's son)  . Heart attack Maternal Grandmother   . Cancer Brother     lymphoma    No Known Allergies  Current Outpatient Prescriptions on File Prior to Visit  Medication Sig Dispense Refill  . aspirin 81 MG tablet Take 81 mg by mouth daily.    . Calcium Carbonate-Vitamin D (CALTRATE 600+D) 600-400 MG-UNIT per tablet Take 1 tablet by mouth 2 (two) times daily.    . cholecalciferol (VITAMIN D) 1000 UNITS tablet Take 3,000 Units by mouth daily.    . clotrimazole-betamethasone (LOTRISONE) cream APPLY TO THE AFFECTED AREA TWICE DAILY 30 g 1  . meloxicam (MOBIC) 7.5 MG tablet TAKE 1 TABLET BY MOUTH DAILY **NEED TO BE SEEN FOR FURTHER REFILLS** 30 tablet 0  . omeprazole (PRILOSEC) 20 MG  capsule TAKE 1 CAPSULE BY MOUTH DAILY 90 capsule 1  . simvastatin (ZOCOR) 40 MG tablet TAKE 1 TABLET (40 MG TOTAL) BY MOUTH AT BEDTIME. 30 tablet 5  . timolol (BETIMOL) 0.25 % ophthalmic solution Place 1 drop into both eyes daily.    . vitamin C (ASCORBIC ACID) 500 MG tablet Take 500 mg by mouth every morning.     No current facility-administered medications on file prior to visit.     BP 128/80   Pulse 67   Temp 98.3 F (36.8 C)   Resp 16   Ht 5\' 4"  (1.626 m)   Wt 149 lb (67.6 kg)   LMP 02/05/1996   SpO2 97%   BMI 25.58 kg/m       Objective:   Physical Exam  Constitutional: She is oriented to person, place, and time. She appears well-developed and well-nourished.  HENT:  Head: Normocephalic and atraumatic.  Cardiovascular: Normal rate, regular rhythm and normal heart sounds.   No murmur heard. Pulmonary/Chest: Effort normal and breath sounds normal. No respiratory distress. She has no wheezes.  Neurological: She is alert and  oriented to person, place, and time.  Skin: Skin is warm and dry.  Psychiatric: She has a normal mood and affect. Her behavior is normal. Judgment and thought content normal.          Assessment & Plan:

## 2015-08-22 NOTE — Assessment & Plan Note (Signed)
Obtain follow up lipid panel, continue statin.  LDL above goal last check.

## 2015-08-22 NOTE — Assessment & Plan Note (Signed)
BP stable, continue current meds. bmet up to date.

## 2015-08-24 ENCOUNTER — Other Ambulatory Visit: Payer: Self-pay | Admitting: Family

## 2015-08-24 MED FILL — MELOXICAM 7.5 MG TABLET: 7.5 | 30 days supply | Qty: 30 | Fill #0

## 2015-08-24 MED FILL — SIMVASTATIN 40 MG TABLET: 40 | 30 days supply | Qty: 30 | Fill #3

## 2015-08-24 NOTE — Telephone Encounter (Signed)
Last refill #30 on 07/20/2015 Last Office visit 08/22/2015

## 2015-08-28 ENCOUNTER — Ambulatory Visit (HOSPITAL_BASED_OUTPATIENT_CLINIC_OR_DEPARTMENT_OTHER)
Admission: RE | Admit: 2015-08-28 | Discharge: 2015-08-28 | Disposition: A | Discharge status: Home / Self Care | Payer: PPO | Source: Ambulatory Visit | Attending: Family | Admitting: Family

## 2015-08-28 DIAGNOSIS — Z1239 Encounter for other screening for malignant neoplasm of breast: Secondary | ICD-10-CM

## 2015-08-28 DIAGNOSIS — Z1231 Encounter for screening mammogram for malignant neoplasm of breast: Secondary | ICD-10-CM | POA: Insufficient documentation

## 2015-09-17 ENCOUNTER — Ambulatory Visit (INDEPENDENT_AMBULATORY_CARE_PROVIDER_SITE_OTHER): Payer: PPO | Admitting: Physician Assistant

## 2015-09-17 ENCOUNTER — Encounter: Payer: Self-pay | Admitting: Physician Assistant

## 2015-09-17 VITALS — BP 133/78 | HR 77 | Temp 97.9°F | Resp 16 | Ht 64.0 in | Wt 147.4 lb

## 2015-09-17 DIAGNOSIS — R399 Unspecified symptoms and signs involving the genitourinary system: Secondary | ICD-10-CM

## 2015-09-17 DIAGNOSIS — N3001 Acute cystitis with hematuria: Secondary | ICD-10-CM | POA: Diagnosis not present

## 2015-09-17 LAB — POCT URINALYSIS DIPSTICK
Bilirubin, UA: NEGATIVE
GLUCOSE UA: NEGATIVE
Ketones, UA: POSITIVE
Nitrite, UA: NEGATIVE
PH UA: 6
PROTEIN UA: NEGATIVE
RBC UA: POSITIVE
SPEC GRAV UA: 1.025
UROBILINOGEN UA: 0.2

## 2015-09-17 MED ORDER — CEPHALEXIN 500 MG PO CAPS
500.0000 mg | ORAL_CAPSULE | Freq: Two times a day (BID) | ORAL | 0 refills | Status: DC
Start: 2015-09-17 — End: 2016-01-09

## 2015-09-17 MED ORDER — ONDANSETRON HCL 4 MG PO TABS: 4.0000 mg | ORAL_TABLET | Freq: Three times a day (TID) | ORAL | 0 refills | Status: DC | PRN

## 2015-09-17 MED FILL — ONDANSETRON HCL 4 MG TABLET: 4 | 7 days supply | Qty: 20 | Fill #0

## 2015-09-17 MED FILL — CEPHALEXIN 500 MG CAPSULE: 500 | 7 days supply | Qty: 14 | Fill #0

## 2015-09-17 NOTE — Progress Notes (Signed)
PCP:O'SULLIVAN,MELISSA S., NP Chief Complaint  Patient presents with  . Urinary Tract Infection    Pt c/o urinary frequency, discomfort with urination, feeling of not emptying bladder, nausea & vomiting, chills & sweats began early this morning [2Am]    Current Issues:  Presents with 1 days of dysuria, urinary urgency and urinary frequency Associated symptoms include:  cloudy urine, nausea, urinary hesitancy, vomiting and low back pain. Endorses one episode of non-bloody emesis this morning after a bout of nausea. Denies vaginal pain, discharge.   There is no history of of similar symptoms. Sexually active:  Yes with female.   No concern for STI.  Prior to Admission medications   Medication Sig Start Date End Date Taking? Authorizing Provider  aspirin 81 MG tablet Take 81 mg by mouth daily.   Yes Historical Provider, MD  Calcium Carbonate-Vitamin D (CALTRATE 600+D) 600-400 MG-UNIT per tablet Take 1 tablet by mouth 2 (two) times daily.   Yes Historical Provider, MD  cholecalciferol (VITAMIN D) 1000 UNITS tablet Take 3,000 Units by mouth daily.   Yes Historical Provider, MD  clotrimazole-betamethasone (LOTRISONE) cream APPLY TO THE AFFECTED AREA TWICE DAILY 02/24/13  Yes Debbrah Alar, NP  hydrochlorothiazide (HYDRODIURIL) 25 MG tablet TAKE 1 TABLET (25 MG TOTAL) BY MOUTH DAILY. 08/22/15  Yes Debbrah Alar, NP  meloxicam (MOBIC) 7.5 MG tablet TAKE 1 TABLET BY MOUTH DAILY **NEED TO BE SEEN FOR FURTHER REFILLS** 08/24/15  Yes Debbrah Alar, NP  omeprazole (PRILOSEC) 20 MG capsule TAKE 1 CAPSULE BY MOUTH DAILY 03/28/15  Yes Debbrah Alar, NP  potassium chloride SA (K-DUR,KLOR-CON) 20 MEQ tablet Take 1 tablet (20 mEq total) by mouth daily. 08/22/15  Yes Debbrah Alar, NP  simvastatin (ZOCOR) 40 MG tablet TAKE 1 TABLET (40 MG TOTAL) BY MOUTH AT BEDTIME. 05/09/15  Yes Debbrah Alar, NP  timolol (BETIMOL) 0.25 % ophthalmic solution Place 1 drop into both eyes daily.   Yes  Historical Provider, MD  vitamin C (ASCORBIC ACID) 500 MG tablet Take 500 mg by mouth every morning.   Yes Historical Provider, MD   Review of Systems:Pertinent ROS are listed in the HPI.   PE:  BP 133/78 (BP Location: Right Arm, Patient Position: Sitting, Cuff Size: Normal)   Pulse 77   Temp 97.9 F (36.6 C) (Oral)   Resp 16   Ht 5\' 4"  (1.626 m)   Wt 147 lb 6 oz (66.8 kg)   LMP 02/05/1996   BMI 25.30 kg/m   General appearance: alert, cooperative, appears stated age and no distress Lungs: clear to auscultation bilaterally Heart: regular rate and rhythm, S1, S2 normal, no murmur, click, rub or gallop Abdomen: soft, non-tender; bowel sounds normal; no masses,  no organomegaly and Negative CVA tenderness  Results for orders placed or performed in visit on 08/22/15  Hemoglobin A1c  Result Value Ref Range   Hgb A1c MFr Bld 6.4 4.6 - 6.5 %  Lipid panel  Result Value Ref Range   Cholesterol 170 0 - 200 mg/dL   Triglycerides 198.0 (H) 0.0 - 149.0 mg/dL   HDL 38.90 (L) >39.00 mg/dL   VLDL 39.6 0.0 - 40.0 mg/dL   LDL Cholesterol 92 0 - 99 mg/dL   Total CHOL/HDL Ratio 4    NonHDL 131.55   HM DIABETES EYE EXAM  Result Value Ref Range   HM Diabetic Eye Exam No Retinopathy No Retinopathy    Assessment and Plan:   1. UTI symptoms Urine dip with blood and 3+ LE. Classic UTI  symptoms. No evidence of CVA tenderness or distress to be concerning for pyelonephritis. Will send for micro and culture. Will start Keflex as directed. Zofran for nausea. FU if not improving within 24-48 hours.

## 2015-09-17 NOTE — Patient Instructions (Addendum)
Please start the antibiotic (Keflex) and nausea medication (Zofran) as directed. Stay hydrated. I will call with results. If symptoms are not improving within 24-48 hours of antibiotic, call or come see me as we may need to consider imaging.   Urinary Tract Infection Urinary tract infections (UTIs) can develop anywhere along your urinary tract. Your urinary tract is your body's drainage system for removing wastes and extra water. Your urinary tract includes two kidneys, two ureters, a bladder, and a urethra. Your kidneys are a pair of bean-shaped organs. Each kidney is about the size of your fist. They are located below your ribs, one on each side of your spine. CAUSES Infections are caused by microbes, which are microscopic organisms, including fungi, viruses, and bacteria. These organisms are so small that they can only be seen through a microscope. Bacteria are the microbes that most commonly cause UTIs. SYMPTOMS  Symptoms of UTIs may vary by age and gender of the patient and by the location of the infection. Symptoms in young women typically include a frequent and intense urge to urinate and a painful, burning feeling in the bladder or urethra during urination. Older women and men are more likely to be tired, shaky, and weak and have muscle aches and abdominal pain. A fever may mean the infection is in your kidneys. Other symptoms of a kidney infection include pain in your back or sides below the ribs, nausea, and vomiting. DIAGNOSIS To diagnose a UTI, your caregiver will ask you about your symptoms. Your caregiver will also ask you to provide a urine sample. The urine sample will be tested for bacteria and white blood cells. White blood cells are made by your body to help fight infection. TREATMENT  Typically, UTIs can be treated with medication. Because most UTIs are caused by a bacterial infection, they usually can be treated with the use of antibiotics. The choice of antibiotic and length of  treatment depend on your symptoms and the type of bacteria causing your infection. HOME CARE INSTRUCTIONS  If you were prescribed antibiotics, take them exactly as your caregiver instructs you. Finish the medication even if you feel better after you have only taken some of the medication.  Drink enough water and fluids to keep your urine clear or pale yellow.  Avoid caffeine, tea, and carbonated beverages. They tend to irritate your bladder.  Empty your bladder often. Avoid holding urine for long periods of time.  Empty your bladder before and after sexual intercourse.  After a bowel movement, women should cleanse from front to back. Use each tissue only once. SEEK MEDICAL CARE IF:   You have back pain.  You develop a fever.  Your symptoms do not begin to resolve within 3 days. SEEK IMMEDIATE MEDICAL CARE IF:   You have severe back pain or lower abdominal pain.  You develop chills.  You have nausea or vomiting.  You have continued burning or discomfort with urination. MAKE SURE YOU:   Understand these instructions.  Will watch your condition.  Will get help right away if you are not doing well or get worse.   This information is not intended to replace advice given to you by your health care provider. Make sure you discuss any questions you have with your health care provider.   Document Released: 10/23/2004 Document Revised: 10/04/2014 Document Reviewed: 02/21/2011 Elsevier Interactive Patient Education [employer].

## 2015-09-18 LAB — URINALYSIS, MICROSCOPIC ONLY

## 2015-09-19 LAB — URINE CULTURE: Organism ID, Bacteria: 10000

## 2015-10-02 ENCOUNTER — Other Ambulatory Visit: Payer: Self-pay | Admitting: Family

## 2015-10-02 MED FILL — HYDROCHLOROTHIAZIDE 25 MG T: 25 | 90 days supply | Qty: 90 | Fill #0

## 2015-10-02 MED FILL — MELOXICAM 7.5 MG TABLET: 7.5 | 30 days supply | Qty: 30 | Fill #0

## 2015-10-02 MED FILL — SIMVASTATIN 40 MG TABLET: 40 | 30 days supply | Qty: 30 | Fill #4

## 2015-10-02 MED FILL — TIMOLOL 0.25% EYE DROPS: 0.25 | 90 days supply | Qty: 10 | Fill #0

## 2015-11-01 ENCOUNTER — Other Ambulatory Visit: Payer: Self-pay | Admitting: Family

## 2015-11-01 MED FILL — OMEPRAZOLE DR 20 MG CAPSULE: 20 | 90 days supply | Qty: 90 | Fill #0

## 2015-11-09 ENCOUNTER — Other Ambulatory Visit: Payer: Self-pay | Admitting: Family

## 2015-11-09 MED FILL — MELOXICAM 7.5 MG TABLET: 7.5 | 30 days supply | Qty: 30 | Fill #0

## 2015-11-09 MED FILL — SIMVASTATIN 40 MG TABLET: 40 | 30 days supply | Qty: 30 | Fill #5

## 2015-12-13 ENCOUNTER — Other Ambulatory Visit: Payer: Self-pay | Admitting: Family

## 2015-12-13 MED FILL — MELOXICAM 7.5 MG TABLET: 7.5 | 30 days supply | Qty: 30 | Fill #0

## 2015-12-13 MED FILL — SIMVASTATIN 40 MG TABLET: 40 | 30 days supply | Qty: 30 | Fill #0

## 2015-12-26 ENCOUNTER — Ambulatory Visit: Payer: PPO | Admitting: Family

## 2016-01-09 ENCOUNTER — Encounter: Payer: Self-pay | Admitting: Family

## 2016-01-09 ENCOUNTER — Ambulatory Visit (INDEPENDENT_AMBULATORY_CARE_PROVIDER_SITE_OTHER): Payer: PPO | Admitting: Family

## 2016-01-09 VITALS — BP 139/79 | HR 76 | Temp 98.4°F | Resp 18 | Ht 64.0 in | Wt 148.0 lb

## 2016-01-09 DIAGNOSIS — E785 Hyperlipidemia, unspecified: Secondary | ICD-10-CM

## 2016-01-09 DIAGNOSIS — I10 Essential (primary) hypertension: Secondary | ICD-10-CM | POA: Diagnosis not present

## 2016-01-09 DIAGNOSIS — E119 Type 2 diabetes mellitus without complications: Secondary | ICD-10-CM

## 2016-01-09 LAB — BASIC METABOLIC PANEL
BUN: 27 mg/dL — ABNORMAL HIGH (ref 6–23)
CHLORIDE: 103 meq/L (ref 96–112)
CO2: 29 meq/L (ref 19–32)
CREATININE: 0.97 mg/dL (ref 0.40–1.20)
Calcium: 10.3 mg/dL (ref 8.4–10.5)
GFR: 60.36 mL/min (ref >60.00)
Glucose, Bld: 99 mg/dL (ref 70–99)
POTASSIUM: 3.7 meq/L (ref 3.5–5.1)
Sodium: 140 mEq/L (ref 135–145)

## 2016-01-09 LAB — LIPID PANEL
CHOL/HDL RATIO: 4
CHOLESTEROL: 181 mg/dL (ref 0–200)
HDL: 41.7 mg/dL (ref >39.00)
LDL Cholesterol: 105 mg/dL — ABNORMAL HIGH (ref 0–99)
NonHDL: 138.8
TRIGLYCERIDES: 170 mg/dL — AB (ref 0.0–149.0)
VLDL: 34 mg/dL (ref 0.0–40.0)

## 2016-01-09 LAB — MICROALBUMIN / CREATININE URINE RATIO
CREATININE, U: 80 mg/dL
MICROALB/CREAT RATIO: 1 mg/g (ref 0.0–30.0)
Microalb, Ur: 0.8 mg/dL (ref 0.0–1.9)

## 2016-01-09 LAB — HEMOGLOBIN A1C: HEMOGLOBIN A1C: 6.3 % (ref 4.6–6.5)

## 2016-01-09 NOTE — Patient Instructions (Signed)
Please complete lab work prior to leaving.   

## 2016-01-09 NOTE — Progress Notes (Signed)
Pre visit review using our clinic review tool, if applicable. No additional management support is needed unless otherwise documented below in the visit note. 

## 2016-01-09 NOTE — Assessment & Plan Note (Signed)
BP stable on current medications. Obtain follow up bmet to assess electrolytes and kidney function.

## 2016-01-09 NOTE — Assessment & Plan Note (Signed)
Clinically stable, obtain a1c.  

## 2016-01-09 NOTE — Progress Notes (Signed)
Subjective:    Patient ID: Jade Gross, female    DOB: Nov 30, 1945, 70 y.o.   MRN: [id number]  HPI  Jade Gross is a 70 yr old female who presents today for follow up.  1) HTN- maintained on hctz, kdur.  Reports good compliance with medication.  BP Readings from Last 3 Encounters:  01/09/16 139/79  09/17/15 133/78  08/22/15 128/80   2) Hyperlipidemia-maintained on simvastatin.  Lab Results  Component Value Date   CHOL 170 08/22/2015   HDL 38.90 (L) 08/22/2015   LDLCALC 92 08/22/2015   TRIG 198.0 (H) 08/22/2015   CHOLHDL 4 08/22/2015   3) DM2- on diet alone. Not currently taking medications.  Lab Results  Component Value Date   HGBA1C 6.4 08/22/2015   HGBA1C 6.4 01/12/2015   HGBA1C 6.2 07/24/2014   Lab Results  Component Value Date   MICROALBUR <0.7 01/12/2015   LDLCALC 92 08/22/2015   CREATININE 1.19 07/03/2015     Review of Systems    see HPI  Past Medical History:  Diagnosis Date  . Borderline glaucoma   . Hyperlipidemia   . Hypertension      Social History   Social History  . Marital status: Divorced    Spouse name: N/A  . Number of children: 1  . Years of education: N/A   Occupational History  .  [place]   Social History Main Topics  . Smoking status: Never Smoker  . Smokeless tobacco: Never Used  . Alcohol use No  . Drug use: No  . Sexual activity: Not on file   Other Topics Concern  . Not on file   Social History Narrative   Caffeine use:  <1 daily   Regular exercise:  No   Never smoked   Work at [employer] as a Librarian, academic in Press photographer   Divorced   She lives with roommate   Completed 12th grade          Past Surgical History:  Procedure Laterality Date  . BUNIONECTOMY  ?2000   bunion removed from both feet  . cataract surger/lens implant  2000   right eye  . Cataract surgery, lens implant and retinal repain  2005   left eye    Family History  Problem Relation Age of Onset  . Heart disease Mother     Pacemaker at age 24   . Hypertension Mother   . Heart disease Father     died at 33 from MI, he had first heart attack at 34  . Stroke Maternal Aunt   . Heart disease      died at 89 from heart attack (Brother's son)  . Heart attack Maternal Grandmother   . Cancer Brother     lymphoma    No Known Allergies  Current Outpatient Prescriptions on File Prior to Visit  Medication Sig Dispense Refill  . aspirin 81 MG tablet Take 81 mg by mouth daily.    . Calcium Carbonate-Vitamin D (CALTRATE 600+D) 600-400 MG-UNIT per tablet Take 1 tablet by mouth 2 (two) times daily.    . cholecalciferol (VITAMIN D) 1000 UNITS tablet Take 3,000 Units by mouth daily.    . clotrimazole-betamethasone (LOTRISONE) cream APPLY TO THE AFFECTED AREA TWICE DAILY 30 g 1  . hydrochlorothiazide (HYDRODIURIL) 25 MG tablet TAKE 1 TABLET (25 MG TOTAL) BY MOUTH DAILY. 90 tablet 1  . meloxicam (MOBIC) 7.5 MG tablet TAKE 1 TABLET (7.5 MG TOTAL) BY MOUTH DAILY. 30 tablet 0  .  omeprazole (PRILOSEC) 20 MG capsule TAKE 1 CAPSULE BY MOUTH DAILY 90 capsule 1  . ondansetron (ZOFRAN) 4 MG tablet Take 1 tablet (4 mg total) by mouth every 8 (eight) hours as needed for nausea or vomiting. 20 tablet 0  . potassium chloride SA (K-DUR,KLOR-CON) 20 MEQ tablet Take 1 tablet (20 mEq total) by mouth daily. 90 tablet 1  . simvastatin (ZOCOR) 40 MG tablet TAKE 1 TABLET BY MOUTH AT BEDTIME 30 tablet 5  . timolol (BETIMOL) 0.25 % ophthalmic solution Place 1 drop into both eyes daily.    . vitamin C (ASCORBIC ACID) 500 MG tablet Take 500 mg by mouth every morning.     No current facility-administered medications on file prior to visit.     BP 139/79 (BP Location: Right Arm, Cuff Size: Normal)   Pulse 76   Temp 98.4 F (36.9 C) (Oral)   Resp 18   Ht 5\' 4"  (1.626 m)   Wt 148 lb (67.1 kg)   LMP 02/05/1996   SpO2 97% Comment: room air  BMI 25.40 kg/m    Objective:   Physical Exam  Constitutional: She is oriented to person, place, and time. She appears  well-developed and well-nourished.  HENT:  Head: Normocephalic and atraumatic.  Cardiovascular: Normal rate, regular rhythm and normal heart sounds.   No murmur heard. Pulmonary/Chest: Effort normal and breath sounds normal. No respiratory distress. She has no wheezes.  Musculoskeletal: She exhibits no edema.  Neurological: She is alert and oriented to person, place, and time.  Psychiatric: She has a normal mood and affect. Her behavior is normal. Judgment and thought content normal.          Assessment & Plan:  We did discuss zostavax and she will check coverage with her insurance and if covered, will plan to give next visit.

## 2016-01-09 NOTE — Assessment & Plan Note (Signed)
Tolerating statin, obtain follow up lipid panel, continue statin.

## 2016-01-24 ENCOUNTER — Other Ambulatory Visit: Payer: Self-pay | Admitting: Family

## 2016-01-24 MED FILL — SIMVASTATIN 40 MG TABLET: 40 | 30 days supply | Qty: 30 | Fill #1

## 2016-01-25 MED FILL — MELOXICAM 7.5 MG TABLET: 7.5 | 30 days supply | Qty: 30 | Fill #0

## 2016-01-30 ENCOUNTER — Ambulatory Visit: Payer: Self-pay | Admitting: Family

## 2016-02-18 ENCOUNTER — Telehealth: Payer: Self-pay | Admitting: Family

## 2016-02-18 NOTE — Telephone Encounter (Signed)
Called patient to scheduled awv. Left msg for patient to call office and schedule appt.

## 2016-02-19 DIAGNOSIS — H35373 Puckering of macula, bilateral: Secondary | ICD-10-CM | POA: Diagnosis not present

## 2016-02-19 DIAGNOSIS — H40053 Ocular hypertension, bilateral: Secondary | ICD-10-CM | POA: Diagnosis not present

## 2016-02-22 ENCOUNTER — Other Ambulatory Visit: Payer: Self-pay | Admitting: Family

## 2016-02-22 MED FILL — MELOXICAM 7.5 MG TABLET: 7.5 | 30 days supply | Qty: 30 | Fill #0

## 2016-02-22 MED FILL — HYDROCHLOROTHIAZIDE 25 MG T: 25 | 90 days supply | Qty: 90 | Fill #1

## 2016-02-22 MED FILL — SIMVASTATIN 40 MG TABLET: 40 | 30 days supply | Qty: 30 | Fill #2

## 2016-02-22 MED FILL — OMEPRAZOLE DR 20 MG CAPSULE: 20 | 90 days supply | Qty: 90 | Fill #1

## 2016-03-03 MED FILL — TIMOLOL 0.25% EYE DROPS: 0.25 | 90 days supply | Qty: 10 | Fill #1

## 2016-04-10 ENCOUNTER — Other Ambulatory Visit: Payer: Self-pay | Admitting: Family

## 2016-04-10 MED FILL — SIMVASTATIN 40 MG TABLET: 40 | 30 days supply | Qty: 30 | Fill #3

## 2016-04-10 MED FILL — MELOXICAM 7.5 MG TABLET: 7.5 | 30 days supply | Qty: 30 | Fill #0

## 2016-04-17 ENCOUNTER — Telehealth: Payer: Self-pay | Admitting: Family

## 2016-04-17 NOTE — Telephone Encounter (Signed)
Left pt message asking to call Allison back directly at [phone] to schedule AWV. Thanks! °

## 2016-05-07 ENCOUNTER — Ambulatory Visit (INDEPENDENT_AMBULATORY_CARE_PROVIDER_SITE_OTHER): Payer: PPO | Admitting: Family

## 2016-05-07 ENCOUNTER — Encounter: Payer: Self-pay | Admitting: Family

## 2016-05-07 VITALS — BP 149/76 | HR 66 | Temp 98.4°F | Resp 16 | Ht 64.0 in | Wt 148.0 lb

## 2016-05-07 DIAGNOSIS — E119 Type 2 diabetes mellitus without complications: Secondary | ICD-10-CM

## 2016-05-07 DIAGNOSIS — E785 Hyperlipidemia, unspecified: Secondary | ICD-10-CM | POA: Diagnosis not present

## 2016-05-07 DIAGNOSIS — I10 Essential (primary) hypertension: Secondary | ICD-10-CM | POA: Diagnosis not present

## 2016-05-07 LAB — LIPID PANEL
Cholesterol: 205 mg/dL — ABNORMAL HIGH (ref 0–200)
HDL: 40.2 mg/dL (ref >39.00)
LDL Cholesterol: 133 mg/dL — ABNORMAL HIGH (ref 0–99)
NONHDL: 164.95
Total CHOL/HDL Ratio: 5
Triglycerides: 160 mg/dL — ABNORMAL HIGH (ref 0.0–149.0)
VLDL: 32 mg/dL (ref 0.0–40.0)

## 2016-05-07 LAB — BASIC METABOLIC PANEL
BUN: 27 mg/dL — AB (ref 6–23)
CHLORIDE: 101 meq/L (ref 96–112)
CO2: 29 meq/L (ref 19–32)
Calcium: 9.6 mg/dL (ref 8.4–10.5)
Creatinine, Ser: 0.99 mg/dL (ref 0.40–1.20)
GFR: 58.9 mL/min — AB (ref >60.00)
GLUCOSE: 101 mg/dL — AB (ref 70–99)
POTASSIUM: 3.8 meq/L (ref 3.5–5.1)
SODIUM: 136 meq/L (ref 135–145)

## 2016-05-07 LAB — HEMOGLOBIN A1C: Hgb A1c MFr Bld: 6.6 % — ABNORMAL HIGH (ref 4.6–6.5)

## 2016-05-07 MED ORDER — LISINOPRIL 10 MG PO TABS: 10.0000 mg | ORAL_TABLET | Freq: Every day | ORAL | 2 refills | Status: DC

## 2016-05-07 MED FILL — LISINOPRIL 10 MG TABLET: 10 | 30 days supply | Qty: 30 | Fill #0

## 2016-05-07 NOTE — Progress Notes (Signed)
Subjective:    Patient ID: Jade Gross, female    DOB: Sep 28, 1945, 71 y.o.   MRN: [id number]  HPI  Jade Gross is a 71 yr old female who presents today for follow up.  1) HTN- maintained on kdur and hctz.  BP Readings from Last 3 Encounters:  05/07/16 (!) 149/76  01/09/16 139/79  09/17/15 133/78   2) DM2- she does not check her sugar at home.  Quit drinking pepsi.  Lab Results  Component Value Date   HGBA1C 6.3 01/09/2016   HGBA1C 6.4 08/22/2015   HGBA1C 6.4 01/12/2015   Lab Results  Component Value Date   MICROALBUR 0.8 01/09/2016   LDLCALC 105 (H) 01/09/2016   CREATININE 0.97 01/09/2016    3) Hyperlipidemia- on simvastatin.  Lab Results  Component Value Date   CHOL 181 01/09/2016   HDL 41.70 01/09/2016   LDLCALC 105 (H) 01/09/2016   TRIG 170.0 (H) 01/09/2016   CHOLHDL 4 01/09/2016      Review of Systems See HPI  Past Medical History:  Diagnosis Date  . Borderline glaucoma   . Hyperlipidemia   . Hypertension      Social History   Social History  . Marital status: Divorced    Spouse name: N/A  . Number of children: 1  . Years of education: N/A   Occupational History  .  [place]   Social History Main Topics  . Smoking status: Never Smoker  . Smokeless tobacco: Never Used  . Alcohol use No  . Drug use: No  . Sexual activity: Not on file   Other Topics Concern  . Not on file   Social History Narrative   Caffeine use:  <1 daily   Regular exercise:  No   Never smoked   Work at [employer] as a Librarian, academic in Press photographer   Divorced   She lives with roommate   Completed 12th grade          Past Surgical History:  Procedure Laterality Date  . BUNIONECTOMY  ?2000   bunion removed from both feet  . cataract surger/lens implant  2000   right eye  . Cataract surgery, lens implant and retinal repain  2005   left eye    Family History  Problem Relation Age of Onset  . Heart disease Mother     Pacemaker at age 20  . Hypertension Mother   .  Heart disease Father     died at 32 from MI, he had first heart attack at 19  . Stroke Maternal Aunt   . Heart disease      died at 2 from heart attack (Brother's son)  . Heart attack Maternal Grandmother   . Cancer Brother     lymphoma    No Known Allergies  Current Outpatient Prescriptions on File Prior to Visit  Medication Sig Dispense Refill  . aspirin 81 MG tablet Take 81 mg by mouth daily.    . Calcium Carbonate-Vitamin D (CALTRATE 600+D) 600-400 MG-UNIT per tablet Take 1 tablet by mouth 2 (two) times daily.    . cholecalciferol (VITAMIN D) 1000 UNITS tablet Take 3,000 Units by mouth daily.    . clotrimazole-betamethasone (LOTRISONE) cream APPLY TO THE AFFECTED AREA TWICE DAILY 30 g 1  . meloxicam (MOBIC) 7.5 MG tablet TAKE 1 TABLET BY MOUTH DAILY 30 tablet 0  . omeprazole (PRILOSEC) 20 MG capsule TAKE 1 CAPSULE BY MOUTH DAILY 90 capsule 1  . ondansetron (ZOFRAN) 4 MG tablet  Take 1 tablet (4 mg total) by mouth every 8 (eight) hours as needed for nausea or vomiting. 20 tablet 0  . simvastatin (ZOCOR) 40 MG tablet TAKE 1 TABLET BY MOUTH AT BEDTIME 30 tablet 5  . timolol (BETIMOL) 0.25 % ophthalmic solution Place 1 drop into both eyes daily.    . vitamin C (ASCORBIC ACID) 500 MG tablet Take 500 mg by mouth every morning.     No current facility-administered medications on file prior to visit.     BP (!) 149/76 (BP Location: Right Arm, Cuff Size: Normal)   Pulse 66   Temp 98.4 F (36.9 C) (Oral)   Resp 16   Ht 5\' 4"  (1.626 m)   Wt 148 lb (67.1 kg)   LMP 02/05/1996   SpO2 100% Comment: room air  BMI 25.40 kg/m       Objective:   Physical Exam  Constitutional: She is oriented to person, place, and time. She appears well-developed and well-nourished.  HENT:  Head: Normocephalic and atraumatic.  Cardiovascular: Normal rate, regular rhythm and normal heart sounds.   No murmur heard. Pulmonary/Chest: Effort normal and breath sounds normal. No respiratory distress. She  has no wheezes.  Musculoskeletal: She exhibits no edema.  Neurological: She is alert and oriented to person, place, and time.  Psychiatric: She has a normal mood and affect. Her behavior is normal. Judgment and thought content normal.          Assessment & Plan:

## 2016-05-07 NOTE — Assessment & Plan Note (Signed)
Clinically stable.  Obtain A1C.

## 2016-05-07 NOTE — Patient Instructions (Signed)
Stop HCTZ, stop Kdur. Begin lisinopril.  Complete lab work prior to leaving.

## 2016-05-07 NOTE — Assessment & Plan Note (Signed)
BP up today. Will try to simplify her regimen. D/c hctz, d/c kdur, begin lisinopril. Follow up in 2 weeks for nurse visit BP check and follow up bmet.

## 2016-05-07 NOTE — Assessment & Plan Note (Signed)
Obtain follow up lipid panel.

## 2016-05-07 NOTE — Progress Notes (Signed)
Pre visit review using our clinic review tool, if applicable. No additional management support is needed unless otherwise documented below in the visit note. 

## 2016-05-08 ENCOUNTER — Telehealth: Payer: Self-pay | Admitting: Family

## 2016-05-08 MED ORDER — ATORVASTATIN CALCIUM 40 MG PO TABS: 40.0000 mg | ORAL_TABLET | Freq: Every day | ORAL | 3 refills | Status: DC

## 2016-05-08 NOTE — Telephone Encounter (Signed)
LMOM with contact name and number for return call RE: results [mentioned change in cholesterol Rx] per provider instructions/SLS 04/12

## 2016-05-08 NOTE — Telephone Encounter (Signed)
Cholesterol is above goal. D/c simvastatin, begin atorvastatin. Sugar a bit higher than last time but still at goal.  Continue to work on diabetic diet.

## 2016-05-13 NOTE — Telephone Encounter (Signed)
My chart message sent to pt.

## 2016-05-15 ENCOUNTER — Other Ambulatory Visit: Payer: Self-pay | Admitting: Family

## 2016-05-15 MED FILL — ATORVASTATIN 40 MG TABLET: 40 | 30 days supply | Qty: 30 | Fill #0

## 2016-05-16 MED FILL — MELOXICAM 7.5 MG TABLET: 7.5 | 30 days supply | Qty: 30 | Fill #0

## 2016-05-21 ENCOUNTER — Ambulatory Visit (INDEPENDENT_AMBULATORY_CARE_PROVIDER_SITE_OTHER): Payer: PPO | Admitting: Family

## 2016-05-21 DIAGNOSIS — I10 Essential (primary) hypertension: Secondary | ICD-10-CM | POA: Diagnosis not present

## 2016-05-21 LAB — BASIC METABOLIC PANEL
BUN: 21 mg/dL (ref 6–23)
CO2: 24 meq/L (ref 19–32)
Calcium: 9.1 mg/dL (ref 8.4–10.5)
Chloride: 108 mEq/L (ref 96–112)
Creatinine, Ser: 1.01 mg/dL (ref 0.40–1.20)
GFR: 57.55 mL/min — ABNORMAL LOW (ref >60.00)
GLUCOSE: 97 mg/dL (ref 70–99)
POTASSIUM: 4 meq/L (ref 3.5–5.1)
SODIUM: 141 meq/L (ref 135–145)

## 2016-05-21 NOTE — Progress Notes (Signed)
Noted and agree. 

## 2016-05-21 NOTE — Progress Notes (Signed)
Pre visit review using our clinic tool,if applicable. No additional management support is needed unless otherwise documented below in the visit note.   Patient in for BP check per order dated 05/07/16 from Debbrah Alar NP.  BP on last OV =149/76 P= 66 Started Lisinopril 10 mg daily.  Stopped HCTZ  BP today = 125/68  P=71  Per Yvone Neu NP patient to continue medications as ordered and get BMET done today before leaving. Return for follow up OV in 3 months . Patient agreed has appointment scheduled for 09/10/16 @8 :00 am.

## 2016-06-05 NOTE — Telephone Encounter (Signed)
Left pt message asking to call Allison back directly at [phone] to schedule AWV. Thanks! °

## 2016-06-12 ENCOUNTER — Other Ambulatory Visit: Payer: Self-pay | Admitting: Family

## 2016-06-12 ENCOUNTER — Other Ambulatory Visit: Payer: Self-pay | Admitting: Family Medicine

## 2016-06-12 MED FILL — OMEPRAZOLE DR 20 MG CAPSULE: 20 | 90 days supply | Qty: 90 | Fill #0

## 2016-06-12 MED FILL — LISINOPRIL 10 MG TABLET: 10 | 30 days supply | Qty: 30 | Fill #1

## 2016-06-12 MED FILL — ATORVASTATIN 40 MG TABLET: 40 | 30 days supply | Qty: 30 | Fill #1

## 2016-06-12 MED FILL — MELOXICAM 7.5 MG TABLET: 7.5 | 30 days supply | Qty: 30 | Fill #0

## 2016-06-13 MED FILL — TIMOLOL 0.25% EYE DROPS: 0.25 | 90 days supply | Qty: 10 | Fill #2

## 2016-06-18 ENCOUNTER — Telehealth: Payer: Self-pay | Admitting: Family

## 2016-06-18 NOTE — Telephone Encounter (Signed)
Called patient to schedule awv. Lvm for patient to call office to schedule appt.  °

## 2016-07-16 MED FILL — ATORVASTATIN 40 MG TABLET: 40 | 24 days supply | Qty: 24 | Fill #2

## 2016-07-16 MED FILL — LISINOPRIL 10 MG TABLET: 10 | 24 days supply | Qty: 24 | Fill #2

## 2016-07-23 ENCOUNTER — Encounter: Payer: Self-pay | Admitting: Family

## 2016-07-23 ENCOUNTER — Ambulatory Visit (INDEPENDENT_AMBULATORY_CARE_PROVIDER_SITE_OTHER): Payer: PPO | Admitting: Family

## 2016-07-23 ENCOUNTER — Other Ambulatory Visit: Payer: Self-pay | Admitting: Family

## 2016-07-23 VITALS — BP 134/74 | HR 81 | Temp 98.4°F | Resp 16 | Ht 64.0 in | Wt 149.8 lb

## 2016-07-23 DIAGNOSIS — H6123 Impacted cerumen, bilateral: Secondary | ICD-10-CM | POA: Diagnosis not present

## 2016-07-23 NOTE — Patient Instructions (Addendum)
Purchase an over the counter ear wax softening kit such as cerumenex or debrox and follow package instructions. Follow up as scheduled. 

## 2016-07-23 NOTE — Progress Notes (Signed)
Subjective:    Patient ID: Jade Gross, female    DOB: [birth year], 71 y.o.   MRN: [id number]  HPI  Jade Gross is a 71 yr old female who presents today with chief complaint of decrease hearing/popping in the right ear.  Symptoms began 4 days ago.     Review of Systems See HPI  Past Medical History:  Diagnosis Date  . Borderline glaucoma   . Hyperlipidemia   . Hypertension      Social History   Social History  . Marital status: Divorced    Spouse name: N/A  . Number of children: 1  . Years of education: N/A   Occupational History  .  [place]   Social History Main Topics  . Smoking status: Never Smoker  . Smokeless tobacco: Never Used  . Alcohol use No  . Drug use: No  . Sexual activity: Not on file   Other Topics Concern  . Not on file   Social History Narrative   Caffeine use:  <1 daily   Regular exercise:  No   Never smoked   Work at [employer] as a Librarian, academic in Press photographer   Divorced   She lives with roommate   Completed 12th grade          Past Surgical History:  Procedure Laterality Date  . BUNIONECTOMY  ?2000   bunion removed from both feet  . cataract surger/lens implant  2000   right eye  . Cataract surgery, lens implant and retinal repain  2005   left eye    Family History  Problem Relation Age of Onset  . Heart disease Mother        Pacemaker at age 65  . Hypertension Mother   . Heart disease Father        died at 29 from MI, he had first heart attack at 84  . Stroke Maternal Aunt   . Heart disease Unknown        died at 27 from heart attack (Brother's son)  . Heart attack Maternal Grandmother   . Cancer Brother        lymphoma    No Known Allergies  Current Outpatient Prescriptions on File Prior to Visit  Medication Sig Dispense Refill  . aspirin 81 MG tablet Take 81 mg by mouth daily.    Marland Kitchen atorvastatin (LIPITOR) 40 MG tablet Take 1 tablet (40 mg total) by mouth daily. 30 tablet 3  . Calcium Carbonate-Vitamin D (CALTRATE  600+D) 600-400 MG-UNIT per tablet Take 1 tablet by mouth 2 (two) times daily.    . cholecalciferol (VITAMIN D) 1000 UNITS tablet Take 3,000 Units by mouth daily.    . clotrimazole-betamethasone (LOTRISONE) cream APPLY TO THE AFFECTED AREA TWICE DAILY 30 g 1  . lisinopril (PRINIVIL,ZESTRIL) 10 MG tablet Take 1 tablet (10 mg total) by mouth daily. 30 tablet 2  . meloxicam (MOBIC) 7.5 MG tablet TAKE 1 TABLET BY MOUTH DAILY 30 tablet 0  . omeprazole (PRILOSEC) 20 MG capsule TAKE 1 CAPSULE BY MOUTH DAILY 90 capsule 1  . ondansetron (ZOFRAN) 4 MG tablet Take 1 tablet (4 mg total) by mouth every 8 (eight) hours as needed for nausea or vomiting. 20 tablet 0  . timolol (BETIMOL) 0.25 % ophthalmic solution Place 1 drop into both eyes daily.    . vitamin C (ASCORBIC ACID) 500 MG tablet Take 500 mg by mouth every morning.     No current facility-administered medications on file prior to  visit.     BP 134/74 (BP Location: Right Arm, Cuff Size: Normal)   Pulse 81   Temp 98.4 F (36.9 C) (Oral)   Resp 16   Ht 5\' 4"  (1.626 m)   Wt 149 lb 12.8 oz (67.9 kg)   LMP 02/05/1996   SpO2 99%   BMI 25.71 kg/m       Objective:   Physical Exam  Constitutional: She appears well-developed and well-nourished.  HENT:  Head: Normocephalic and atraumatic.  Bilateral cerumen impaction noted R>L  Cardiovascular: Normal rate, regular rhythm and normal heart sounds.   No murmur heard. Pulmonary/Chest: Effort normal and breath sounds normal. No respiratory distress. She has no wheezes.  Psychiatric: She has a normal mood and affect. Her behavior is normal. Judgment and thought content normal.          Assessment & Plan:  Cerumen impaction- Ceruminosis is noted.  Wax is removed by syringing and manual debridement. There was some deep material in the right ear that I was unable to remove and therefore was unable to visualize TM on right  L TM is normal.  Instructions for home care to prevent wax buildup are  given.

## 2016-08-01 MED FILL — MELOXICAM 7.5 MG TABLET: 7.5 | 30 days supply | Qty: 30 | Fill #0

## 2016-08-11 ENCOUNTER — Other Ambulatory Visit: Payer: Self-pay | Admitting: Family

## 2016-08-19 DIAGNOSIS — H40053 Ocular hypertension, bilateral: Secondary | ICD-10-CM | POA: Diagnosis not present

## 2016-08-20 MED FILL — ATORVASTATIN 40 MG TABLET: 40 | 20 days supply | Qty: 20 | Fill #3

## 2016-08-27 ENCOUNTER — Other Ambulatory Visit: Payer: Self-pay | Admitting: Family

## 2016-08-27 MED ORDER — MELOXICAM 7.5 MG PO TABS: 7.5000 mg | ORAL_TABLET | Freq: Every day | ORAL | 0 refills | Status: DC

## 2016-09-08 MED FILL — LISINOPRIL 10 MG TABLET: 10 | 30 days supply | Qty: 30 | Fill #0

## 2016-09-10 ENCOUNTER — Ambulatory Visit (INDEPENDENT_AMBULATORY_CARE_PROVIDER_SITE_OTHER): Payer: PPO | Admitting: Family

## 2016-09-10 ENCOUNTER — Encounter: Payer: Self-pay | Admitting: Family

## 2016-09-10 VITALS — BP 134/81 | HR 66 | Temp 98.4°F | Resp 16 | Ht 64.0 in | Wt 152.0 lb

## 2016-09-10 DIAGNOSIS — E119 Type 2 diabetes mellitus without complications: Secondary | ICD-10-CM

## 2016-09-10 DIAGNOSIS — E785 Hyperlipidemia, unspecified: Secondary | ICD-10-CM

## 2016-09-10 DIAGNOSIS — I10 Essential (primary) hypertension: Secondary | ICD-10-CM

## 2016-09-10 DIAGNOSIS — M1990 Unspecified osteoarthritis, unspecified site: Secondary | ICD-10-CM

## 2016-09-10 MED ORDER — LISINOPRIL 10 MG PO TABS: 10.0000 mg | ORAL_TABLET | Freq: Every day | ORAL | 1 refills | Status: DC

## 2016-09-10 MED ORDER — MELOXICAM 7.5 MG PO TABS: 7.5000 mg | ORAL_TABLET | Freq: Every day | ORAL | 1 refills | Status: DC

## 2016-09-10 MED ORDER — ATORVASTATIN CALCIUM 40 MG PO TABS: 40.0000 mg | ORAL_TABLET | Freq: Every day | ORAL | 1 refills | Status: DC

## 2016-09-10 MED FILL — ATORVASTATIN 40 MG TABLET: 40 | 90 days supply | Qty: 90 | Fill #0

## 2016-09-10 MED FILL — MELOXICAM 7.5 MG TABLET: 7.5 | 90 days supply | Qty: 90 | Fill #0

## 2016-09-10 NOTE — Progress Notes (Signed)
Subjective:    Patient ID: Jade Gross, female    DOB: [birth year], 71 y.o.   MRN: [id number]  HPI  Patient is a 71 year old female who presents today for routine follow-up.  Hypertension-she is maintained on lisinopril 10 mg by mouth daily.  BP Readings from Last 3 Encounters:  09/10/16 134/81  07/23/16 134/74  05/07/16 (!) 149/76   Hyperlipidemia-maintained on Lipitor 40 mg once daily. Lab Results  Component Value Date   CHOL 205 (H) 05/07/2016   HDL 40.20 05/07/2016   LDLCALC 133 (H) 05/07/2016   TRIG 160.0 (H) 05/07/2016   CHOLHDL 5 05/07/2016   DM2- she is maintained on diabetic diet. Lab Results  Component Value Date   HGBA1C 6.6 (H) 05/07/2016   HGBA1C 6.3 01/09/2016   HGBA1C 6.4 08/22/2015   Lab Results  Component Value Date   MICROALBUR 0.8 01/09/2016   LDLCALC 133 (H) 05/07/2016   CREATININE 1.01 05/21/2016   Osteoarthritis-she continues meloxicam once daily as needed. She reports swelling of her fingers for 1-2 months. Also reports some swelling of the right foot.   Review of Systems    see HPI  Past Medical History:  Diagnosis Date  . Borderline glaucoma   . Hyperlipidemia   . Hypertension      Social History   Social History  . Marital status: Divorced    Spouse name: N/A  . Number of children: 1  . Years of education: N/A   Occupational History  .  [employer]   Social History Main Topics  . Smoking status: Never Smoker  . Smokeless tobacco: Never Used  . Alcohol use No  . Drug use: No  . Sexual activity: Not on file   Other Topics Concern  . Not on file   Social History Narrative   Caffeine use:  <1 daily   Regular exercise:  No   Never smoked   Work at [employer] as a Librarian, academic in Press photographer   Divorced   She lives with roommate   Completed 12th grade          Past Surgical History:  Procedure Laterality Date  . BUNIONECTOMY  ?2000   bunion removed from both feet  . cataract surger/lens implant  2000   right eye  .  Cataract surgery, lens implant and retinal repain  2005   left eye    Family History  Problem Relation Age of Onset  . Heart disease Mother        Pacemaker at age 26  . Hypertension Mother   . Heart disease Father        died at 85 from MI, he had first heart attack at 83  . Stroke Maternal Aunt   . Heart disease Unknown        died at 15 from heart attack (Brother's son)  . Heart attack Maternal Grandmother   . Cancer Brother        lymphoma    No Known Allergies  Current Outpatient Prescriptions on File Prior to Visit  Medication Sig Dispense Refill  . aspirin 81 MG tablet Take 81 mg by mouth daily.    Marland Kitchen atorvastatin (LIPITOR) 40 MG tablet Take 1 tablet (40 mg total) by mouth daily. 30 tablet 3  . Calcium Carbonate-Vitamin D (CALTRATE 600+D) 600-400 MG-UNIT per tablet Take 1 tablet by mouth 2 (two) times daily.    . cholecalciferol (VITAMIN D) 1000 UNITS tablet Take 3,000 Units by mouth daily.    Marland Kitchen  clotrimazole-betamethasone (LOTRISONE) cream APPLY TO THE AFFECTED AREA TWICE DAILY 30 g 1  . lisinopril (PRINIVIL,ZESTRIL) 10 MG tablet TAKE 1 TABLET (10 MG TOTAL) BY MOUTH DAILY. 30 tablet 5  . meloxicam (MOBIC) 7.5 MG tablet Take 1 tablet (7.5 mg total) by mouth daily. 30 tablet 0  . omeprazole (PRILOSEC) 20 MG capsule TAKE 1 CAPSULE BY MOUTH DAILY 90 capsule 1  . ondansetron (ZOFRAN) 4 MG tablet Take 1 tablet (4 mg total) by mouth every 8 (eight) hours as needed for nausea or vomiting. 20 tablet 0  . timolol (BETIMOL) 0.25 % ophthalmic solution Place 1 drop into both eyes daily.    . vitamin C (ASCORBIC ACID) 500 MG tablet Take 500 mg by mouth every morning.     No current facility-administered medications on file prior to visit.     BP 134/81 (BP Location: Right Arm, Cuff Size: Normal)   Pulse 66   Temp 98.4 F (36.9 C) (Oral)   Resp 16   Ht 5\' 4"  (1.626 m)   Wt 152 lb (68.9 kg)   LMP 02/05/1996   SpO2 99%   BMI 26.09 kg/m    Objective:   Physical Exam    Constitutional: She is oriented to person, place, and time. She appears well-developed and well-nourished.  Cardiovascular: Normal rate, regular rhythm and normal heart sounds.   No murmur heard. Pulmonary/Chest: Effort normal and breath sounds normal. No respiratory distress. She has no wheezes.  Musculoskeletal: She exhibits no edema.  Heberden's nodules noted bilateral hands  Neurological: She is alert and oriented to person, place, and time.  Psychiatric: She has a normal mood and affect. Her behavior is normal. Judgment and thought content normal.          Assessment & Plan:  Osteoarthritis- continue meloxicam.  HTN- BP stable on current meds continue same. Obtain follow up bmet.  DM2- A1C at goal.  Repeat A1C today.  Hyperlipidemia- tolerating statin, obtain follow up lipid panel.

## 2016-09-10 NOTE — Patient Instructions (Signed)
Please complete lab work prior to leaving.   

## 2016-09-15 MED FILL — TIMOLOL 0.25% EYE DROPS: 0.25 | 90 days supply | Qty: 10 | Fill #0

## 2016-10-14 MED FILL — LISINOPRIL 10 MG TABLET: 10 | 30 days supply | Qty: 30 | Fill #1

## 2016-10-17 MED FILL — OMEPRAZOLE 20 MG CAP: 20 | 90 days supply | Qty: 90 | Fill #1

## 2016-12-22 MED FILL — TIMOLOL 0.25% EYE DROPS: 0.25 | 90 days supply | Qty: 10 | Fill #1

## 2016-12-24 ENCOUNTER — Ambulatory Visit: Payer: PPO | Admitting: Family

## 2016-12-24 ENCOUNTER — Encounter: Payer: Self-pay | Admitting: Family

## 2016-12-24 VITALS — BP 122/75 | HR 67 | Temp 98.1°F | Resp 16 | Ht 64.0 in | Wt 154.2 lb

## 2016-12-24 DIAGNOSIS — E119 Type 2 diabetes mellitus without complications: Secondary | ICD-10-CM

## 2016-12-24 DIAGNOSIS — E785 Hyperlipidemia, unspecified: Secondary | ICD-10-CM

## 2016-12-24 DIAGNOSIS — I10 Essential (primary) hypertension: Secondary | ICD-10-CM

## 2016-12-24 LAB — COMPREHENSIVE METABOLIC PANEL
ALBUMIN: 4.3 g/dL (ref 3.5–5.2)
ALT: 10 U/L (ref 0–35)
AST: 15 U/L (ref 0–37)
Alkaline Phosphatase: 84 U/L (ref 39–117)
BILIRUBIN TOTAL: 0.3 mg/dL (ref 0.2–1.2)
BUN: 28 mg/dL — AB (ref 6–23)
CALCIUM: 9.9 mg/dL (ref 8.4–10.5)
CHLORIDE: 104 meq/L (ref 96–112)
CO2: 28 meq/L (ref 19–32)
CREATININE: 1.16 mg/dL (ref 0.40–1.20)
GFR: 48.97 mL/min — ABNORMAL LOW (ref >60.00)
GLUCOSE: 104 mg/dL — AB (ref 70–99)
Potassium: 4.7 mEq/L (ref 3.5–5.1)
SODIUM: 138 meq/L (ref 135–145)
Total Protein: 7.6 g/dL (ref 6.0–8.3)

## 2016-12-24 LAB — LIPID PANEL
CHOL/HDL RATIO: 5
Cholesterol: 181 mg/dL (ref 0–200)
HDL: 37.2 mg/dL — ABNORMAL LOW (ref >39.00)
LDL CALC: 113 mg/dL — AB (ref 0–99)
NONHDL: 143.79
TRIGLYCERIDES: 156 mg/dL — AB (ref 0.0–149.0)
VLDL: 31.2 mg/dL (ref 0.0–40.0)

## 2016-12-24 LAB — HEMOGLOBIN A1C: HEMOGLOBIN A1C: 6.5 % (ref 4.6–6.5)

## 2016-12-24 MED ORDER — ZOSTER VAC RECOMB ADJUVANTED 50 MCG/0.5ML IM SUSR: 0.5000 mL | Freq: Once | INTRAMUSCULAR | 1 refills | Status: AC

## 2016-12-24 NOTE — Progress Notes (Signed)
Subjective:    Patient ID: Jade Gross, female    DOB: 07-11-1945, 71 y.o.   MRN: [id number]  HPI  Jade Gross is a 71 yr old female who presents today for follow up.  1) DM2- maintained on diet alone. Reports that her diet has been fair.   Lab Results  Component Value Date   HGBA1C 6.6 (H) 05/07/2016   HGBA1C 6.3 01/09/2016   HGBA1C 6.4 08/22/2015   Lab Results  Component Value Date   MICROALBUR 0.8 01/09/2016   LDLCALC 133 (H) 05/07/2016   CREATININE 1.01 05/21/2016   HTN- maintained on lisinopril 10mg . Denies swelling or SOB.   BP Readings from Last 3 Encounters:  12/24/16 122/75  09/10/16 134/81  07/23/16 134/74   Hyperlipidemia-  Maintained on atorvastatin.  Lab Results  Component Value Date   CHOL 205 (H) 05/07/2016   HDL 40.20 05/07/2016   LDLCALC 133 (H) 05/07/2016   TRIG 160.0 (H) 05/07/2016   CHOLHDL 5 05/07/2016     Review of Systems See HPI  Past Medical History:  Diagnosis Date  . Borderline glaucoma   . Hyperlipidemia   . Hypertension      Social History   Socioeconomic History  . Marital status: Divorced    Spouse name: Not on file  . Number of children: 1  . Years of education: Not on file  . Highest education level: Not on file  Social Needs  . Financial resource strain: Not on file  . Food insecurity - worry: Not on file  . Food insecurity - inability: Not on file  . Transportation needs - medical: Not on file  . Transportation needs - non-medical: Not on file  Occupational History    Employer: [employer]  Tobacco Use  . Smoking status: Never Smoker  . Smokeless tobacco: Never Used  Substance and Sexual Activity  . Alcohol use: No  . Drug use: No  . Sexual activity: Not on file  Other Topics Concern  . Not on file  Social History Narrative   Caffeine use:  <1 daily   Regular exercise:  No   Never smoked   Work at [employer] as a Librarian, academic in Press photographer   Divorced   She lives with roommate   Completed 12th grade           Past Surgical History:  Procedure Laterality Date  . BUNIONECTOMY  ?2000   bunion removed from both feet  . cataract surger/lens implant  2000   right eye  . Cataract surgery, lens implant and retinal repain  2005   left eye    Family History  Problem Relation Age of Onset  . Heart disease Mother        Pacemaker at age 83  . Hypertension Mother   . Heart disease Father        died at 61 from MI, he had first heart attack at 41  . Stroke Maternal Aunt   . Heart disease Unknown        died at 28 from heart attack (Brother's son)  . Heart attack Maternal Grandmother   . Cancer Brother        lymphoma    No Known Allergies  Current Outpatient Medications on File Prior to Visit  Medication Sig Dispense Refill  . aspirin 81 MG tablet Take 81 mg by mouth daily.    Marland Kitchen atorvastatin (LIPITOR) 40 MG tablet Take 1 tablet (40 mg total) by mouth daily. 90 tablet  1  . Calcium Carbonate-Vitamin D (CALTRATE 600+D) 600-400 MG-UNIT per tablet Take 1 tablet by mouth 2 (two) times daily.    . cholecalciferol (VITAMIN D) 1000 UNITS tablet Take 3,000 Units by mouth daily.    . clotrimazole-betamethasone (LOTRISONE) cream APPLY TO THE AFFECTED AREA TWICE DAILY 30 g 1  . lisinopril (PRINIVIL,ZESTRIL) 10 MG tablet Take 1 tablet (10 mg total) by mouth daily. 90 tablet 1  . meloxicam (MOBIC) 7.5 MG tablet Take 1 tablet (7.5 mg total) by mouth daily. 90 tablet 1  . omeprazole (PRILOSEC) 20 MG capsule TAKE 1 CAPSULE BY MOUTH DAILY 90 capsule 1  . ondansetron (ZOFRAN) 4 MG tablet Take 1 tablet (4 mg total) by mouth every 8 (eight) hours as needed for nausea or vomiting. 20 tablet 0  . timolol (BETIMOL) 0.25 % ophthalmic solution Place 1 drop into both eyes daily.    . vitamin C (ASCORBIC ACID) 500 MG tablet Take 500 mg by mouth every morning.     No current facility-administered medications on file prior to visit.     BP 122/75 (BP Location: Right Arm, Cuff Size: Normal)   Pulse 67   Temp 98.1  F (36.7 C) (Oral)   Resp 16   Ht 5\' 4"  (1.626 m)   Wt 154 lb 3.2 oz (69.9 kg)   LMP 02/05/1996   SpO2 100%   BMI 26.47 kg/m       Objective:   Physical Exam  Constitutional: She is oriented to person, place, and time. She appears well-developed and well-nourished.  HENT:  Head: Normocephalic and atraumatic.  Cardiovascular: Normal rate, regular rhythm and normal heart sounds.  No murmur heard. Pulmonary/Chest: Effort normal and breath sounds normal. No respiratory distress. She has no wheezes.  Musculoskeletal: She exhibits no edema.  Neurological: She is alert and oriented to person, place, and time.  Psychiatric: She has a normal mood and affect. Her behavior is normal. Judgment and thought content normal.          Assessment & Plan:

## 2016-12-24 NOTE — Assessment & Plan Note (Signed)
Clinically stable, obtain A1C.

## 2016-12-24 NOTE — Assessment & Plan Note (Signed)
Tolerating statin, obtain follow-up lipid panel. 

## 2016-12-24 NOTE — Patient Instructions (Signed)
Please complete lab work prior to leaving.   

## 2016-12-24 NOTE — Assessment & Plan Note (Signed)
Stable on lisinopril continue same.

## 2016-12-25 ENCOUNTER — Encounter: Payer: Self-pay | Admitting: Family

## 2017-01-02 MED FILL — LISINOPRIL 10 MG TABLET: 10 | 30 days supply | Qty: 30 | Fill #2

## 2017-02-24 DIAGNOSIS — H40059 Ocular hypertension, unspecified eye: Secondary | ICD-10-CM | POA: Diagnosis not present

## 2017-02-25 MED FILL — LISINOPRIL 10 MG TABS: 10 | 30 days supply | Qty: 30 | Fill #3

## 2017-02-25 MED FILL — ATORVASTATIN 40 MG TABLET: 40 | 90 days supply | Qty: 90 | Fill #1

## 2017-03-04 ENCOUNTER — Other Ambulatory Visit: Payer: Self-pay | Admitting: Family Medicine

## 2017-03-04 MED FILL — OMEPRAZOLE 20 MG CAP: 20 | 90 days supply | Qty: 90 | Fill #0

## 2017-03-24 DIAGNOSIS — H40053 Ocular hypertension, bilateral: Secondary | ICD-10-CM | POA: Diagnosis not present

## 2017-03-24 DIAGNOSIS — H43813 Vitreous degeneration, bilateral: Secondary | ICD-10-CM | POA: Diagnosis not present

## 2017-03-24 DIAGNOSIS — H59812 Chorioretinal scars after surgery for detachment, left eye: Secondary | ICD-10-CM | POA: Diagnosis not present

## 2017-04-03 MED FILL — LISINOPRIL 10 MG TABS: 10 | 30 days supply | Qty: 30 | Fill #4

## 2017-04-22 ENCOUNTER — Ambulatory Visit (INDEPENDENT_AMBULATORY_CARE_PROVIDER_SITE_OTHER): Payer: PPO | Admitting: Family

## 2017-04-22 ENCOUNTER — Encounter: Payer: Self-pay | Admitting: Family

## 2017-04-22 VITALS — BP 126/72 | HR 64 | Temp 98.3°F | Resp 18 | Ht 64.0 in | Wt 159.0 lb

## 2017-04-22 DIAGNOSIS — I10 Essential (primary) hypertension: Secondary | ICD-10-CM

## 2017-04-22 DIAGNOSIS — E785 Hyperlipidemia, unspecified: Secondary | ICD-10-CM | POA: Diagnosis not present

## 2017-04-22 DIAGNOSIS — E119 Type 2 diabetes mellitus without complications: Secondary | ICD-10-CM | POA: Diagnosis not present

## 2017-04-22 DIAGNOSIS — K219 Gastro-esophageal reflux disease without esophagitis: Secondary | ICD-10-CM | POA: Diagnosis not present

## 2017-04-22 MED ORDER — BETAMETHASONE DIPROPIONATE 0.05 % EX CREA: TOPICAL_CREAM | Freq: Two times a day (BID) | CUTANEOUS | 0 refills | Status: DC

## 2017-04-22 MED FILL — BETAMETHASONE DP 0.05% CRM: 0.05 | 15 days supply | Qty: 30 | Fill #0

## 2017-04-22 NOTE — Patient Instructions (Signed)
Please complete lab work prior to leaving.   

## 2017-04-22 NOTE — Progress Notes (Signed)
Subjective:    Patient ID: Jade Gross, female    DOB: May 29, 1945, 72 y.o.   MRN: [id number]  HPI  Diabetes type 2- working on diet, feels like she will do better in the summer time.  Lab Results  Component Value Date   HGBA1C 6.5 12/24/2016   HGBA1C 6.6 (H) 05/07/2016   HGBA1C 6.3 01/09/2016   Lab Results  Component Value Date   MICROALBUR 0.8 01/09/2016   LDLCALC 113 (H) 12/24/2016   CREATININE 1.16 12/24/2016   Hyperlipidemia- maintained on atorvastatin.  Denies unusual myalgia. Lab Results  Component Value Date   CHOL 181 12/24/2016   HDL 37.20 (L) 12/24/2016   LDLCALC 113 (H) 12/24/2016   TRIG 156.0 (H) 12/24/2016   CHOLHDL 5 12/24/2016   GERD-maintained on omeprazole, stable.   HTN- maintained on lisinopril.  BP Readings from Last 3 Encounters:  04/22/17 126/72  12/24/16 122/75  09/10/16 134/81     Review of Systems See HPI  Past Medical History:  Diagnosis Date  . Borderline glaucoma   . Hyperlipidemia   . Hypertension      Social History   Socioeconomic History  . Marital status: Divorced    Spouse name: Not on file  . Number of children: 1  . Years of education: Not on file  . Highest education level: Not on file  Occupational History    Employer: [employer]  . Financial resource strain: Not on file  . Food insecurity:    Worry: Not on file    Inability: Not on file  . Transportation needs:    Medical: Not on file    Non-medical: Not on file  Tobacco Use  . Smoking status: Never Smoker  . Smokeless tobacco: Never Used  Substance and Sexual Activity  . Alcohol use: No  . Drug use: No  . Sexual activity: Not on file  Lifestyle  . Physical activity:    Days per week: Not on file    Minutes per session: Not on file  . Stress: Not on file  Relationships  . Social connections:    Talks on phone: Not on file    Gets together: Not on file    Attends religious service: Not on file    Active member of club or  organization: Not on file    Attends meetings of clubs or organizations: Not on file    Relationship status: Not on file  . Intimate partner violence:    Fear of current or ex partner: Not on file    Emotionally abused: Not on file    Physically abused: Not on file    Forced sexual activity: Not on file  Other Topics Concern  . 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 BUNIONECTOMY  ?2000   bunion removed from both feet  . cataract surger/lens implant  2000   right eye  . Cataract surgery, lens implant and retinal repain  2005   left eye    Family History  Problem Relation Age of Onset  . Heart disease Mother        Pacemaker at age 69  .  Hypertension Mother   . Heart disease Father        died at 14 from MI, he had first heart attack at 82  . Stroke Maternal Aunt   . Heart disease Unknown        died at 79 from heart attack (Brother's son)  . Heart attack Maternal Grandmother   . Cancer Brother        lymphoma    No Known Allergies  Current Outpatient Medications on File Prior to Visit  Medication Sig Dispense Refill  . aspirin 81 MG tablet Take 81 mg by mouth daily.    Marland Kitchen atorvastatin (LIPITOR) 40 MG tablet Take 1 tablet (40 mg total) by mouth daily. 90 tablet 1  . Calcium Carbonate-Vitamin D (CALTRATE 600+D) 600-400 MG-UNIT per tablet Take 1 tablet by mouth 2 (two) times daily.    . cholecalciferol (VITAMIN D) 1000 UNITS tablet Take 3,000 Units by mouth daily.    . clotrimazole-betamethasone (LOTRISONE) cream APPLY TO THE AFFECTED AREA TWICE DAILY 30 g 1  . lisinopril (PRINIVIL,ZESTRIL) 10 MG tablet Take 1 tablet (10 mg total) by mouth daily. 90 tablet 1  . omeprazole (PRILOSEC) 20 MG capsule TAKE 1 CAPSULE BY MOUTH DAILY 90 capsule 1   . timolol (BETIMOL) 0.25 % ophthalmic solution Place 1 drop into both eyes daily.    . vitamin C (ASCORBIC ACID) 500 MG tablet Take 500 mg by mouth every morning.     No current facility-administered medications on file prior to visit.     BP 126/72 (BP Location: Right Arm, Cuff Size: Normal)   Pulse 64   Temp 98.3 F (36.8 C) (Oral)   Resp 18   Ht 5\' 4"  (1.626 m)   Wt 159 lb (72.1 kg)   LMP 02/05/1996   SpO2 99%   BMI 27.29 kg/m       Objective:   Physical Exam  Constitutional: She is oriented to person, place, and time. She appears well-developed and well-nourished.  HENT:  Head: Normocephalic and atraumatic.  Cardiovascular: Normal rate, regular rhythm and normal heart sounds.  No murmur heard. Pulmonary/Chest: Effort normal and breath sounds normal. No respiratory distress. She has no wheezes.  Neurological: She is alert and oriented to person, place, and time.  Psychiatric: She has a normal mood and affect. Her behavior is normal. Judgment and thought content normal.          Assessment & Plan:  DM2- clinically stable. Obtain A1C. Discussed diabetic diet.   HTN- BP stable on lisinopril continue same.  Hyperlipidemia- pt is fasting, will obtain follow up lipid panel.    GERD- stable on PPI, continue same.

## 2017-04-23 ENCOUNTER — Telehealth: Payer: Self-pay | Admitting: Family

## 2017-04-23 ENCOUNTER — Other Ambulatory Visit (INDEPENDENT_AMBULATORY_CARE_PROVIDER_SITE_OTHER): Payer: PPO

## 2017-04-23 DIAGNOSIS — E119 Type 2 diabetes mellitus without complications: Secondary | ICD-10-CM

## 2017-04-23 LAB — BASIC METABOLIC PANEL
BUN: 26 mg/dL — AB (ref 6–23)
CALCIUM: 9.1 mg/dL (ref 8.4–10.5)
CO2: 25 meq/L (ref 19–32)
CREATININE: 1.25 mg/dL — AB (ref 0.40–1.20)
Chloride: 106 mEq/L (ref 96–112)
GFR: 44.88 mL/min — ABNORMAL LOW (ref >60.00)
GLUCOSE: 102 mg/dL — AB (ref 70–99)
Potassium: 4.9 mEq/L (ref 3.5–5.1)
Sodium: 139 mEq/L (ref 135–145)

## 2017-04-23 LAB — LIPID PANEL
CHOLESTEROL: 144 mg/dL (ref 0–200)
HDL: 39.5 mg/dL (ref >39.00)
LDL CALC: 84 mg/dL (ref 0–99)
NonHDL: 104.16
Total CHOL/HDL Ratio: 4
Triglycerides: 103 mg/dL (ref 0.0–149.0)
VLDL: 20.6 mg/dL (ref 0.0–40.0)

## 2017-04-23 LAB — HEMOGLOBIN A1C: HEMOGLOBIN A1C: 6.7 % — AB (ref 4.6–6.5)

## 2017-04-23 NOTE — Telephone Encounter (Signed)
See mychart.  

## 2017-05-05 MED FILL — LISINOPRIL 10 MG TABLET: 10 | 30 days supply | Qty: 30 | Fill #5

## 2017-05-05 MED FILL — TIMOLOL 0.25% EYE DROPS: 0.25 | 90 days supply | Qty: 10 | Fill #2

## 2017-06-05 ENCOUNTER — Other Ambulatory Visit: Payer: Self-pay | Admitting: Family

## 2017-06-05 MED FILL — LISINOPRIL 10 MG TABLET: 10 | 90 days supply | Qty: 90 | Fill #0

## 2017-06-05 MED FILL — OMEPRAZOLE 20 MG CAP: 20 | 90 days supply | Qty: 90 | Fill #1

## 2017-06-08 MED FILL — ATORVASTATIN 40 MG TABLET: 40 | 90 days supply | Qty: 90 | Fill #0

## 2017-08-03 MED FILL — MELOXICAM 7.5 MG TABLET: 7.5 | 90 days supply | Qty: 90 | Fill #1

## 2017-08-18 MED FILL — TIMOLOL 0.25% EYE DROPS: 0.25 | 90 days supply | Qty: 10 | Fill #0

## 2017-08-26 ENCOUNTER — Ambulatory Visit (INDEPENDENT_AMBULATORY_CARE_PROVIDER_SITE_OTHER): Payer: PPO | Admitting: Family

## 2017-08-26 ENCOUNTER — Encounter: Payer: Self-pay | Admitting: Family

## 2017-08-26 VITALS — BP 123/69 | HR 64 | Temp 98.5°F | Resp 16 | Ht 64.0 in | Wt 161.4 lb

## 2017-08-26 DIAGNOSIS — E119 Type 2 diabetes mellitus without complications: Secondary | ICD-10-CM

## 2017-08-26 DIAGNOSIS — I10 Essential (primary) hypertension: Secondary | ICD-10-CM | POA: Diagnosis not present

## 2017-08-26 DIAGNOSIS — E785 Hyperlipidemia, unspecified: Secondary | ICD-10-CM | POA: Diagnosis not present

## 2017-08-26 LAB — BASIC METABOLIC PANEL
BUN: 28 mg/dL — ABNORMAL HIGH (ref 6–23)
CO2: 31 mEq/L (ref 19–32)
Calcium: 10.5 mg/dL (ref 8.4–10.5)
Chloride: 102 mEq/L (ref 96–112)
Creatinine, Ser: 1.11 mg/dL (ref 0.40–1.20)
GFR: 51.42 mL/min — AB (ref >60.00)
GLUCOSE: 107 mg/dL — AB (ref 70–99)
POTASSIUM: 5.6 meq/L — AB (ref 3.5–5.1)
Sodium: 140 mEq/L (ref 135–145)

## 2017-08-26 LAB — HEMOGLOBIN A1C: Hgb A1c MFr Bld: 6.6 % — ABNORMAL HIGH (ref 4.6–6.5)

## 2017-08-26 NOTE — Progress Notes (Signed)
Subjective:    Patient ID: Jade Gross, female    DOB: 13-Nov-1945, 72 y.o.   MRN: [id number]  HPI  Patient is a 71 yr old female who presents today for follow up.  HTN- maintained on lisinopril 10mg .  BP Readings from Last 3 Encounters:  08/26/17 123/69  04/22/17 126/72  12/24/16 122/75   DM2- not on meds.  Lab Results  Component Value Date   HGBA1C 6.7 (H) 04/23/2017   HGBA1C 6.5 12/24/2016   HGBA1C 6.6 (H) 05/07/2016   Lab Results  Component Value Date   MICROALBUR 0.8 01/09/2016   LDLCALC 84 04/23/2017   CREATININE 1.25 (H) 04/23/2017   Hyperlipidemia- on lipitor 40mg .  Lab Results  Component Value Date   CHOL 144 04/23/2017   HDL 39.50 04/23/2017   LDLCALC 84 04/23/2017   TRIG 103.0 04/23/2017   CHOLHDL 4 04/23/2017    Review of Systems See HPI    Past Medical History:  Diagnosis Date  . Borderline glaucoma   . Hyperlipidemia   . Hypertension      Social History   Socioeconomic History  . Marital status: Divorced    Spouse name: Not on file  . Number of children: 1  . Years of education: Not on file  . Highest education level: Not on file  Occupational History    Employer: [employer]  . Financial resource strain: Not on file  . Food insecurity:    Worry: Not on file    Inability: Not on file  . Transportation needs:    Medical: Not on file    Non-medical: Not on file  Tobacco Use  . Smoking status: Never Smoker  . Smokeless tobacco: Never Used  Substance and Sexual Activity  . Alcohol use: No  . Drug use: No  . Sexual activity: Not on file  Lifestyle  . Physical activity:    Days per week: Not on file    Minutes per session: Not on file  . Stress: Not on file  Relationships  . Social connections:    Talks on phone: Not on file    Gets together: Not on file    Attends religious service: Not on file    Active member of club or organization: Not on file    Attends meetings of clubs or organizations: Not on file   Relationship status: Not on file  . Intimate partner violence:    Fear of current or ex partner: Not on file    Emotionally abused: Not on file    Physically abused: Not on file    Forced sexual activity: Not on file  Other Topics Concern  . 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 BUNIONECTOMY  ?2000   bunion removed from both feet  . cataract surger/lens implant  2000   right eye  . Cataract surgery, lens implant and retinal repain  2005   left eye    Family History  Problem Relation Age of Onset  . Heart disease Mother        Pacemaker at age 62  . Hypertension Mother   . Heart disease Father  died at 59 from MI, he had first heart attack at 30  . Stroke Maternal Aunt   . Heart disease Unknown        died at 18 from heart attack (Brother's son)  . Heart attack Maternal Grandmother   . Cancer Brother        lymphoma    No Known Allergies  Current Outpatient Medications on File Prior to Visit  Medication Sig Dispense Refill  . aspirin 81 MG tablet Take 81 mg by mouth daily.    Marland Kitchen atorvastatin (LIPITOR) 40 MG tablet TAKE 1 TABLET BY MOUTH DAILY 90 tablet 1  . betamethasone dipropionate (DIPROLENE) 0.05 % cream Apply topically 2 (two) times daily. 30 g 0  . Calcium Carbonate-Vitamin D (CALTRATE 600+D) 600-400 MG-UNIT per tablet Take 1 tablet by mouth 2 (two) times daily.    . cholecalciferol (VITAMIN D) 1000 UNITS tablet Take 3,000 Units by mouth daily.    Marland Kitchen lisinopril (PRINIVIL,ZESTRIL) 10 MG tablet Take 1 tablet (10 mg total) by mouth daily. 90 tablet 1  . omeprazole (PRILOSEC) 20 MG capsule TAKE 1 CAPSULE BY MOUTH DAILY 90 capsule 1  . timolol (BETIMOL) 0.25 % ophthalmic solution Place 1 drop into both eyes daily.    . vitamin C  (ASCORBIC ACID) 500 MG tablet Take 500 mg by mouth every morning.     No current facility-administered medications on file prior to visit.     BP 123/69 (BP Location: Right Arm, Cuff Size: Normal)   Pulse 64   Temp 98.5 F (36.9 C) (Oral)   Resp 16   Ht 5\' 4"  (1.626 m)   Wt 161 lb 6.4 oz (73.2 kg)   LMP 02/05/1996   SpO2 98%   BMI 27.70 kg/m    Objective:   Physical Exam  Constitutional: She appears well-developed and well-nourished.  Cardiovascular: Normal rate, regular rhythm and normal heart sounds.  No murmur heard. Pulmonary/Chest: Effort normal and breath sounds normal. No respiratory distress. She has no wheezes.  Psychiatric: She has a normal mood and affect. Her behavior is normal. Judgment and thought content normal.          Assessment & Plan:  DM2- clinically stable.  Obtain follow up A1C.  HTN- BP stable on current meds.  Hyperlipidemia- LDL at goal, continue statin.

## 2017-08-26 NOTE — Patient Instructions (Signed)
Please complete lab work prior to leaving.   

## 2017-08-27 ENCOUNTER — Telehealth: Payer: Self-pay | Admitting: Family

## 2017-08-27 DIAGNOSIS — E875 Hyperkalemia: Secondary | ICD-10-CM

## 2017-08-27 NOTE — Telephone Encounter (Signed)
Called patient left message for return call regarding lab results.

## 2017-08-27 NOTE — Telephone Encounter (Signed)
Potassium is elevated.  Lisinopril can raise potassium level.  I would like for her to discontinue lisinopril.  Instead for her blood pressure she should begin amlodipine 5 mg once daily.  Please follow-up in 1 week for nurse visit to have blood pressure rechecked and follow-up basic metabolic panel.  Diagnosis is hyperkalemia.

## 2017-08-28 DIAGNOSIS — H40053 Ocular hypertension, bilateral: Secondary | ICD-10-CM | POA: Diagnosis not present

## 2017-08-28 MED ORDER — AMLODIPINE BESYLATE 5 MG PO TABS: 5.0000 mg | ORAL_TABLET | Freq: Every day | ORAL | 0 refills | Status: DC

## 2017-08-28 MED FILL — AMLODIPINE BESYLATE 5 MG TA: 5 | 30 days supply | Qty: 30 | Fill #0

## 2017-08-28 NOTE — Telephone Encounter (Signed)
Attempted to reach pt and left message to check mychart acct. Message sent. 

## 2017-09-10 ENCOUNTER — Other Ambulatory Visit: Payer: Self-pay | Admitting: Family

## 2017-09-10 MED FILL — OMEPRAZOLE 20 MG CPDR: 20 | 90 days supply | Qty: 90 | Fill #0

## 2017-09-10 MED FILL — ATORVASTATIN 40 MG TABLET: 40 | 90 days supply | Qty: 90 | Fill #1

## 2017-09-30 ENCOUNTER — Ambulatory Visit: Payer: PPO | Admitting: Family

## 2017-09-30 ENCOUNTER — Other Ambulatory Visit (INDEPENDENT_AMBULATORY_CARE_PROVIDER_SITE_OTHER): Payer: PPO

## 2017-09-30 VITALS — BP 135/82 | HR 62

## 2017-09-30 DIAGNOSIS — E875 Hyperkalemia: Secondary | ICD-10-CM | POA: Diagnosis not present

## 2017-09-30 DIAGNOSIS — I10 Essential (primary) hypertension: Secondary | ICD-10-CM

## 2017-09-30 LAB — BASIC METABOLIC PANEL
BUN: 28 mg/dL — ABNORMAL HIGH (ref 6–23)
CO2: 30 meq/L (ref 19–32)
CREATININE: 0.92 mg/dL (ref 0.40–1.20)
Calcium: 10.5 mg/dL (ref 8.4–10.5)
Chloride: 103 mEq/L (ref 96–112)
GFR: 63.85 mL/min (ref >60.00)
Glucose, Bld: 98 mg/dL (ref 70–99)
Potassium: 4.8 mEq/L (ref 3.5–5.1)
Sodium: 140 mEq/L (ref 135–145)

## 2017-09-30 NOTE — Addendum Note (Signed)
Addended by: Vernie Shanks E on: 09/30/2017 11:25 AM   Modules accepted: Orders

## 2017-09-30 NOTE — Progress Notes (Signed)
Pre visit review using our clinic tool,if applicable. No additional management support is needed unless otherwise documented below in the visit note.   Pt here for Blood pressure check per  Debbrah Alar dated 08/28/17.  Pt currently takes: Amlodipine 5 mg q pm   Pt reports compliance with medication.  BP today @ = 135/82  P = 64  Pt advised per Elvera Maria, NP   Had BMET  Drawn today,continue Amlodipine daily as ordered and return for OV with provider in 3 months. Patient agreed.

## 2017-10-01 ENCOUNTER — Other Ambulatory Visit: Payer: Self-pay | Admitting: Family

## 2017-10-01 MED FILL — AMLODIPINE BESYLATE 5 MG TA: 5 | 90 days supply | Qty: 90 | Fill #0

## 2017-11-05 ENCOUNTER — Other Ambulatory Visit: Payer: Self-pay | Admitting: Family

## 2017-11-05 NOTE — Telephone Encounter (Signed)
Records indicate this was discontinued in March but, patient reports she is still on Meloxicam, the last refill she received was for  90 days and she is running out.  She is requesting new refill.

## 2017-11-06 MED FILL — MELOXICAM 7.5 MG TABLET: 7.5 | 90 days supply | Qty: 90 | Fill #0

## 2017-12-03 MED FILL — TIMOLOL 0.25% EYE DROPS: 0.25 | 90 days supply | Qty: 10 | Fill #1

## 2017-12-17 ENCOUNTER — Other Ambulatory Visit: Payer: Self-pay | Admitting: Family

## 2017-12-17 MED FILL — OMEPRAZOLE 20 MG CPDR: 20 | 90 days supply | Qty: 90 | Fill #1

## 2017-12-17 MED FILL — ATORVASTATIN 40 MG TABLET: 40 | 90 days supply | Qty: 90 | Fill #0

## 2017-12-30 ENCOUNTER — Ambulatory Visit: Payer: PPO | Admitting: Family

## 2018-02-08 MED FILL — AMLODIPINE BESYLATE 5 MG TA: 5 | 90 days supply | Qty: 90 | Fill #1

## 2018-03-31 MED FILL — TIMOLOL 0.25% EYE DROPS: 0.25 | 90 days supply | Qty: 10 | Fill #2

## 2018-04-23 ENCOUNTER — Other Ambulatory Visit: Payer: Self-pay | Admitting: Family

## 2018-04-23 MED FILL — MELOXICAM 7.5 MG TABLET: 7.5 | 90 days supply | Qty: 90 | Fill #1

## 2018-04-23 MED FILL — ATORVASTATIN 40 MG TABLET: 40 | 90 days supply | Qty: 90 | Fill #1

## 2018-04-23 MED FILL — OMEPRAZOLE 20 MG CPDR: 20 | 90 days supply | Qty: 90 | Fill #0

## 2018-06-08 DIAGNOSIS — H43813 Vitreous degeneration, bilateral: Secondary | ICD-10-CM | POA: Diagnosis not present

## 2018-06-08 DIAGNOSIS — H59812 Chorioretinal scars after surgery for detachment, left eye: Secondary | ICD-10-CM | POA: Diagnosis not present

## 2018-06-08 DIAGNOSIS — H40053 Ocular hypertension, bilateral: Secondary | ICD-10-CM | POA: Diagnosis not present

## 2018-06-08 LAB — HM DIABETES EYE EXAM

## 2018-06-11 ENCOUNTER — Other Ambulatory Visit: Payer: Self-pay | Admitting: Family

## 2018-06-14 MED FILL — AMLODIPINE BESYLATE 5 MG TA: 5 | 14 days supply | Qty: 14 | Fill #0

## 2018-08-16 MED FILL — TIMOLOL 0.25% EYE DROPS: 0.25 | 90 days supply | Qty: 10 | Fill #0

## 2018-08-30 ENCOUNTER — Other Ambulatory Visit: Payer: Self-pay

## 2018-09-10 ENCOUNTER — Other Ambulatory Visit: Payer: Self-pay

## 2018-09-10 ENCOUNTER — Ambulatory Visit (HOSPITAL_BASED_OUTPATIENT_CLINIC_OR_DEPARTMENT_OTHER)
Admission: RE | Admit: 2018-09-10 | Discharge: 2018-09-10 | Disposition: A | Discharge status: Home / Self Care | Payer: PPO | Source: Ambulatory Visit | Attending: Family | Admitting: Family

## 2018-09-10 ENCOUNTER — Telehealth: Payer: Self-pay | Admitting: Family

## 2018-09-10 ENCOUNTER — Ambulatory Visit (INDEPENDENT_AMBULATORY_CARE_PROVIDER_SITE_OTHER): Payer: PPO | Admitting: Family

## 2018-09-10 ENCOUNTER — Encounter: Payer: Self-pay | Admitting: Family

## 2018-09-10 VITALS — BP 155/89 | HR 70 | Temp 98.0°F | Resp 16 | Ht 62.0 in | Wt 156.6 lb

## 2018-09-10 DIAGNOSIS — E119 Type 2 diabetes mellitus without complications: Secondary | ICD-10-CM

## 2018-09-10 DIAGNOSIS — R1011 Right upper quadrant pain: Secondary | ICD-10-CM

## 2018-09-10 DIAGNOSIS — E785 Hyperlipidemia, unspecified: Secondary | ICD-10-CM | POA: Diagnosis not present

## 2018-09-10 DIAGNOSIS — K219 Gastro-esophageal reflux disease without esophagitis: Secondary | ICD-10-CM | POA: Diagnosis not present

## 2018-09-10 DIAGNOSIS — M79642 Pain in left hand: Secondary | ICD-10-CM | POA: Diagnosis not present

## 2018-09-10 DIAGNOSIS — R110 Nausea: Secondary | ICD-10-CM

## 2018-09-10 LAB — LIPID PANEL
Cholesterol: 207 mg/dL — ABNORMAL HIGH (ref 0–200)
HDL: 46.1 mg/dL (ref >39.00)
LDL Cholesterol: 134 mg/dL — ABNORMAL HIGH (ref 0–99)
NonHDL: 161.3
Total CHOL/HDL Ratio: 4
Triglycerides: 139 mg/dL (ref 0.0–149.0)
VLDL: 27.8 mg/dL (ref 0.0–40.0)

## 2018-09-10 LAB — COMPREHENSIVE METABOLIC PANEL
ALT: 11 U/L (ref 0–35)
AST: 15 U/L (ref 0–37)
Albumin: 4.6 g/dL (ref 3.5–5.2)
Alkaline Phosphatase: 91 U/L (ref 39–117)
BUN: 20 mg/dL (ref 6–23)
CO2: 31 mEq/L (ref 19–32)
Calcium: 10.4 mg/dL (ref 8.4–10.5)
Chloride: 101 mEq/L (ref 96–112)
Creatinine, Ser: 0.97 mg/dL (ref 0.40–1.20)
GFR: 56.36 mL/min — ABNORMAL LOW (ref >60.00)
Glucose, Bld: 97 mg/dL (ref 70–99)
Potassium: 4.3 mEq/L (ref 3.5–5.1)
Sodium: 139 mEq/L (ref 135–145)
Total Bilirubin: 0.4 mg/dL (ref 0.2–1.2)
Total Protein: 7.2 g/dL (ref 6.0–8.3)

## 2018-09-10 LAB — CBC WITH DIFFERENTIAL/PLATELET
Basophils Absolute: 0.1 10*3/uL (ref 0.0–0.1)
Basophils Relative: 0.9 % (ref 0.0–3.0)
Eosinophils Absolute: 0.1 10*3/uL (ref 0.0–0.7)
Eosinophils Relative: 2 % (ref 0.0–5.0)
HCT: 39.6 % (ref 36.0–46.0)
Hemoglobin: 12.5 g/dL (ref 12.0–15.0)
Lymphocytes Relative: 16.9 % (ref 12.0–46.0)
Lymphs Abs: 1.3 10*3/uL (ref 0.7–4.0)
MCHC: 31.7 g/dL (ref 30.0–36.0)
MCV: 93.2 fl (ref 78.0–100.0)
Monocytes Absolute: 0.6 10*3/uL (ref 0.1–1.0)
Monocytes Relative: 8.3 % (ref 3.0–12.0)
Neutro Abs: 5.4 10*3/uL (ref 1.4–7.7)
Neutrophils Relative %: 71.9 % (ref 43.0–77.0)
Platelets: 290 10*3/uL (ref 150.0–400.0)
RBC: 4.25 Mil/uL (ref 3.87–5.11)
RDW: 14.8 % (ref 11.5–15.5)
WBC: 7.5 10*3/uL (ref 4.0–10.5)

## 2018-09-10 LAB — HEMOGLOBIN A1C: Hgb A1c MFr Bld: 6.5 % (ref 4.6–6.5)

## 2018-09-10 MED ORDER — OMEPRAZOLE 20 MG PO CPDR: 20.0000 mg | DELAYED_RELEASE_CAPSULE | Freq: Every day | ORAL | 1 refills | Status: DC

## 2018-09-10 MED ORDER — ATORVASTATIN CALCIUM 40 MG PO TABS: 40.0000 mg | ORAL_TABLET | Freq: Every day | ORAL | 1 refills | Status: DC

## 2018-09-10 MED ORDER — OMEPRAZOLE 40 MG PO CPDR: 40.0000 mg | DELAYED_RELEASE_CAPSULE | Freq: Every day | ORAL | 3 refills | Status: DC

## 2018-09-10 MED ORDER — MELOXICAM 7.5 MG PO TABS: 7.5000 mg | ORAL_TABLET | Freq: Every day | ORAL | 1 refills | Status: DC

## 2018-09-10 MED ORDER — AMLODIPINE BESYLATE 5 MG PO TABS: 5.0000 mg | ORAL_TABLET | Freq: Every day | ORAL | 1 refills | Status: DC

## 2018-09-10 MED FILL — MELOXICAM 7.5 MG TABLET: 7.5 | 90 days supply | Qty: 90 | Fill #0

## 2018-09-10 MED FILL — ATORVASTATIN 40 MG TABLET: 40 | 90 days supply | Qty: 90 | Fill #0

## 2018-09-10 MED FILL — OMEPRAZOLE 40 MG CPDR: 40 | 30 days supply | Qty: 30 | Fill #0

## 2018-09-10 MED FILL — AMLODIPINE BESYLATE 5 MG TA: 5 | 90 days supply | Qty: 90 | Fill #0

## 2018-09-10 MED FILL — OMEPRAZOLE 20 MG CAP: 20 | 90 days supply | Qty: 90 | Fill #0

## 2018-09-10 NOTE — Telephone Encounter (Signed)
Left message on voicemail for her to check mychart.

## 2018-09-10 NOTE — Patient Instructions (Signed)
Please complete lab work prior to leaving. Return at 1:15 for 1:30 ultrasound.

## 2018-09-10 NOTE — Progress Notes (Signed)
Subjective:    Patient ID: Jade Gross, female    DOB: May 05, 1945, 73 y.o.   MRN: [id number]  HPI  Pt reports that "every time I eat" she develops a stabbing pain under her ribs and this radiates into her back.  Goes away in about 2 hrs.  Reports that symptoms started Tuesday evening and has happened every day since.  She vomited x 1 on Saturday night.    DM2- diet controlled.  Lab Results  Component Value Date   HGBA1C 6.6 (H) 08/26/2017   HGBA1C 6.7 (H) 04/23/2017   HGBA1C 6.5 12/24/2016   Lab Results  Component Value Date   MICROALBUR 0.8 01/09/2016   LDLCALC 84 04/23/2017   CREATININE 0.92 09/30/2017   HTN- ran out of amlodipine.  BP Readings from Last 3 Encounters:  09/10/18 (!) 155/89  09/30/17 135/82  08/26/17 123/69     Review of Systems See HPI  Past Medical History:  Diagnosis Date  . Borderline glaucoma   . Hyperlipidemia   . Hypertension      Social History   Socioeconomic History  . Marital status: Divorced    Spouse name: Not on file  . Number of children: 1  . Years of education: Not on file  . Highest education level: Not on file  Occupational History    Employer: [employer]  . Financial resource strain: Not on file  . Food insecurity    Worry: Not on file    Inability: Not on file  . Transportation needs    Medical: Not on file    Non-medical: Not on file  Tobacco Use  . Smoking status: Never Smoker  . Smokeless tobacco: Never Used  Substance and Sexual Activity  . Alcohol use: No  . Drug use: No  . Sexual activity: Not on file  Lifestyle  . Physical activity    Days per week: Not on file    Minutes per session: Not on file  . Stress: Not on file  Relationships  . Social Herbalist on phone: Not on file    Gets together: Not on file    Attends religious service: Not on file    Active member of club or organization: Not on file    Attends meetings of clubs or organizations: Not on file   Relationship status: Not on file  . Intimate partner violence    Fear of current or ex partner: Not on file    Emotionally abused: Not on file    Physically abused: Not on file    Forced sexual activity: Not on file  Other Topics Concern  . 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 BUNIONECTOMY  ?2000   bunion removed from both feet  . cataract surger/lens implant  2000   right eye  . Cataract surgery, lens implant and retinal repain  2005   left eye    Family History  Problem Relation Age of Onset  . Heart disease Mother        Pacemaker at age 3  . Hypertension Mother   . Heart disease Father  died at 73 from MI, he had first heart attack at 27  . Stroke Maternal Aunt   . Heart disease Unknown        died at 67 from heart attack (Brother's son)  . Heart attack Maternal Grandmother   . Cancer Brother        lymphoma    Allergies  Allergen Reactions  . Lisinopril     Hyperkalemia     Current Outpatient Medications on File Prior to Visit  Medication Sig Dispense Refill  . amLODipine (NORVASC) 5 MG tablet TAKE 1 TABLET BY MOUTH ONCE DAILY 14 tablet 0  . aspirin 81 MG tablet Take 81 mg by mouth daily.    Marland Kitchen atorvastatin (LIPITOR) 40 MG tablet TAKE 1 TABLET BY MOUTH DAILY 90 tablet 1  . betamethasone dipropionate (DIPROLENE) 0.05 % cream Apply topically 2 (two) times daily. 30 g 0  . Calcium Carbonate-Vitamin D (CALTRATE 600+D) 600-400 MG-UNIT per tablet Take 1 tablet by mouth 2 (two) times daily.    . cholecalciferol (VITAMIN D) 1000 UNITS tablet Take 3,000 Units by mouth daily.    . meloxicam (MOBIC) 7.5 MG tablet TAKE 1 TABLET BY MOUTH DAILY 90 tablet 1  . omeprazole (PRILOSEC) 20 MG capsule TAKE 1 CAPSULE BY MOUTH DAILY 90  capsule 1  . timolol (BETIMOL) 0.25 % ophthalmic solution Place 1 drop into both eyes daily.    . vitamin C (ASCORBIC ACID) 500 MG tablet Take 500 mg by mouth every morning.     No current facility-administered medications on file prior to visit.     BP (!) 155/89 (BP Location: Right Arm, Patient Position: Sitting, Cuff Size: Small)   Pulse 70   Temp 98 F (36.7 C) (Temporal)   Resp 16   Ht 5\' 2"  (1.575 m)   Wt 156 lb 9.6 oz (71 kg)   LMP 02/05/1996   SpO2 98%   BMI 28.64 kg/m       Objective:   Physical Exam Constitutional:      Appearance: She is well-developed.  Neck:     Musculoskeletal: Neck supple.     Thyroid: No thyromegaly.  Cardiovascular:     Rate and Rhythm: Normal rate and regular rhythm.     Heart sounds: Normal heart sounds. No murmur.  Pulmonary:     Effort: Pulmonary effort is normal. No respiratory distress.     Breath sounds: Normal breath sounds. No wheezing.  Abdominal:     Palpations: Abdomen is soft.     Tenderness: There is no abdominal tenderness. Negative signs include Murphy's sign.  Skin:    General: Skin is warm and dry.  Neurological:     Mental Status: She is alert and oriented to person, place, and time.  Psychiatric:        Behavior: Behavior normal.        Thought Content: Thought content normal.        Judgment: Judgment normal.           Assessment & Plan:  RUQ pain- concerning for cholecystitis. Obtain cbc, LFT, abdominal US later today.  She is advised to go to the ER if she is unable to eat/drink or if severe/worsening pain.  DM2- obtain a1c.  HTN- uncontrolled off of amlodipine. Resume- refills sent.  GERD- stable on PPI, continue same.  Arthritis pain/hand pain- continues meloxicam prn.

## 2018-09-10 NOTE — Addendum Note (Signed)
Addended by: Caffie Pinto on: 09/10/2018 02:29 PM   Modules accepted: Orders

## 2018-09-11 LAB — MICROALBUMIN / CREATININE URINE RATIO
Creatinine, Urine: 113 mg/dL (ref 20–275)
Microalb Creat Ratio: 8 mcg/mg creat (ref <30)
Microalb, Ur: 0.9 mg/dL

## 2018-09-12 ENCOUNTER — Encounter: Payer: Self-pay | Admitting: Family

## 2018-10-21 ENCOUNTER — Encounter: Payer: Self-pay | Admitting: Internal Medicine

## 2018-12-10 ENCOUNTER — Ambulatory Visit: Payer: PPO | Admitting: Family

## 2018-12-13 MED FILL — ATORVASTATIN 40 MG TABLET: 40 | 90 days supply | Qty: 90 | Fill #1

## 2018-12-13 MED FILL — TIMOLOL 0.25% EYE DROPS: 0.25 | 90 days supply | Qty: 10 | Fill #1

## 2018-12-13 MED FILL — MELOXICAM 7.5 MG TABLET: 7.5 | 90 days supply | Qty: 90 | Fill #1

## 2018-12-13 MED FILL — AMLODIPINE BESYLATE 5 MG TA: 5 | 90 days supply | Qty: 90 | Fill #1

## 2018-12-13 MED FILL — OMEPRAZOLE 20 MG CAP: 20 | 90 days supply | Qty: 90 | Fill #1

## 2019-03-11 ENCOUNTER — Telehealth: Payer: Self-pay | Admitting: Family

## 2019-03-11 NOTE — Chronic Care Management (AMB) (Signed)
  Chronic Care Management   Note  03/11/2019 Name: Jade Gross MRN: [id number] DOB: October 28, 1945  Jade Gross is a 74 y.o. year old female who is a primary care patient of Debbrah Alar, NP. I reached out to Huntley Estelle by phone today in response to a referral sent by Ms. Elizabeth Sauer [name] PCP, Debbrah Alar, NP.   Ms. Porta was given information about Chronic Care Management services today including:  1. CCM service includes personalized support from designated clinical staff supervised by her physician, including individualized plan of care and coordination with other care providers 2. 24/7 contact phone numbers for assistance for urgent and routine care needs. 3. Service will only be billed when office clinical staff spend 20 minutes or more in a month to coordinate care. 4. Only one practitioner may furnish and bill the service in a calendar month. 5. The patient may stop CCM services at any time (effective at the end of the month) by phone call to the office staff. 6. The patient will be responsible for cost sharing (co-pay) of up to 20% of the service fee (after annual deductible is met).  Patient agreed to services and verbal consent obtained.   Follow up plan:   Raynicia Dukes [employer] Scheduler

## 2019-03-22 ENCOUNTER — Telehealth: Payer: PPO

## 2019-04-05 ENCOUNTER — Other Ambulatory Visit: Payer: Self-pay | Admitting: Family

## 2019-04-05 MED FILL — OMEPRAZOLE 20 MG CAP: 20 | 90 days supply | Qty: 90 | Fill #1

## 2019-04-05 MED FILL — TIMOLOL 0.25% EYE DROPS: 0.25 | 90 days supply | Qty: 10 | Fill #2

## 2019-04-06 MED ORDER — BETAMETHASONE DIPROPIONATE 0.05 % EX CREA: TOPICAL_CREAM | Freq: Two times a day (BID) | CUTANEOUS | 0 refills | Status: DC

## 2019-04-06 MED FILL — MELOXICAM 7.5 MG TABLET: 7.5 | 90 days supply | Qty: 90 | Fill #0

## 2019-04-06 MED FILL — BETAMETHASONE DP 0.05% CRM: 0.05 | 15 days supply | Qty: 30 | Fill #0

## 2019-04-06 MED FILL — AMLODIPINE BESYLATE 5 MG TA: 5 | 90 days supply | Qty: 90 | Fill #0

## 2019-04-06 MED FILL — ATORVASTATIN 40 MG TABLET: 40 | 90 days supply | Qty: 90 | Fill #0

## 2019-04-20 ENCOUNTER — Encounter: Payer: Self-pay | Admitting: Family

## 2019-04-20 ENCOUNTER — Other Ambulatory Visit: Payer: Self-pay

## 2019-04-20 ENCOUNTER — Telehealth: Payer: Self-pay | Admitting: Family

## 2019-04-20 ENCOUNTER — Ambulatory Visit (INDEPENDENT_AMBULATORY_CARE_PROVIDER_SITE_OTHER): Payer: PPO | Admitting: Family

## 2019-04-20 VITALS — BP 152/76 | HR 74 | Temp 97.0°F | Resp 16 | Ht 62.0 in | Wt 163.0 lb

## 2019-04-20 DIAGNOSIS — I10 Essential (primary) hypertension: Secondary | ICD-10-CM

## 2019-04-20 DIAGNOSIS — E119 Type 2 diabetes mellitus without complications: Secondary | ICD-10-CM | POA: Diagnosis not present

## 2019-04-20 DIAGNOSIS — E785 Hyperlipidemia, unspecified: Secondary | ICD-10-CM

## 2019-04-20 DIAGNOSIS — R590 Localized enlarged lymph nodes: Secondary | ICD-10-CM

## 2019-04-20 DIAGNOSIS — Z1159 Encounter for screening for other viral diseases: Secondary | ICD-10-CM | POA: Diagnosis not present

## 2019-04-20 DIAGNOSIS — Z1231 Encounter for screening mammogram for malignant neoplasm of breast: Secondary | ICD-10-CM

## 2019-04-20 LAB — BASIC METABOLIC PANEL
BUN: 29 mg/dL — ABNORMAL HIGH (ref 6–23)
CO2: 26 mEq/L (ref 19–32)
Calcium: 9.3 mg/dL (ref 8.4–10.5)
Chloride: 104 mEq/L (ref 96–112)
Creatinine, Ser: 0.81 mg/dL (ref 0.40–1.20)
GFR: 69.28 mL/min (ref >60.00)
Glucose, Bld: 95 mg/dL (ref 70–99)
Potassium: 4.4 mEq/L (ref 3.5–5.1)
Sodium: 140 mEq/L (ref 135–145)

## 2019-04-20 LAB — LIPID PANEL
Cholesterol: 153 mg/dL (ref 0–200)
HDL: 43.9 mg/dL (ref >39.00)
LDL Cholesterol: 85 mg/dL (ref 0–99)
NonHDL: 108.86
Total CHOL/HDL Ratio: 3
Triglycerides: 117 mg/dL (ref 0.0–149.0)
VLDL: 23.4 mg/dL (ref 0.0–40.0)

## 2019-04-20 LAB — HEMOGLOBIN A1C: Hgb A1c MFr Bld: 6.3 % (ref 4.6–6.5)

## 2019-04-20 MED ORDER — AMOXICILLIN-POT CLAVULANATE 875-125 MG PO TABS: 1.0000 | ORAL_TABLET | Freq: Two times a day (BID) | ORAL | 0 refills | Status: DC

## 2019-04-20 MED ORDER — AMLODIPINE BESYLATE 5 MG PO TABS: 10.0000 mg | ORAL_TABLET | Freq: Every day | ORAL | 1 refills | Status: DC

## 2019-04-20 MED FILL — AMOX-CLAV 875-125 MG TABLET: 875-125 | 10 days supply | Qty: 20 | Fill #0

## 2019-04-20 NOTE — Telephone Encounter (Signed)
Medical records request faxed to Dr. Angela [place]

## 2019-04-20 NOTE — Telephone Encounter (Addendum)
Please request eye exam from Jade Gross in [place]

## 2019-04-20 NOTE — Patient Instructions (Addendum)
Start Augmentin (antibiotics) OMRON arm cuff is best blood pressure cuff for home.  Please increase amlodipine to 10mg  once daily.

## 2019-04-20 NOTE — Progress Notes (Signed)
Subjective:    Patient ID: Jade Gross, female    DOB: 1945/02/27, 74 y.o.   MRN: [id number]  HPI  Patient is a 74 year old female who presents today with chief complaint of swollen lymph node in her right anterior neck.  She reports that the area is non-tender.  She thinks that she noticed it a few months ago.  Reports that she did have the [place] Covid vaccine but that this swelling was present prior to the vaccinations.  She denies any other swollen lymph nodes.  HTN-  Maintained on amlodipine 5mg .  Denies LE edema. BP Readings from Last 3 Encounters:  04/20/19 (!) 169/85  09/10/18 (!) 155/89  09/30/17 135/82   Diabetes type 2-this is currently diet controlled. Lab Results  Component Value Date   HGBA1C 6.5 09/10/2018   Hyperlipidemia-maintained on atorvastatin 40 mg daily. Lab Results  Component Value Date   CHOL 207 (H) 09/10/2018   HDL 46.10 09/10/2018   LDLCALC 134 (H) 09/10/2018   TRIG 139.0 09/10/2018   CHOLHDL 4 09/10/2018     Review of Systems    see HPI  Past Medical History:  Diagnosis Date  . Borderline glaucoma   . Hyperlipidemia   . Hypertension      Social History   Socioeconomic History  . Marital status: Divorced    Spouse name: Not on file  . Number of children: 1  . Years of education: Not on file  . Highest education level: Not on file  Occupational History    Employer: [employer]  Tobacco Use  . Smoking status: Never Smoker  . Smokeless tobacco: Never Used  Substance and Sexual Activity  . Alcohol use: No  . Drug use: No  . Sexual activity: Not on file  Other Topics Concern  . Not on file  Social History Narrative   Caffeine use:  <1 daily   Regular exercise:  No   Never smoked   Work at [employer] as a Librarian, academic in Press photographer   Divorced   She lives with roommate   Completed 12th grade         Social Determinants of Health   Financial Resource Strain:   . Difficulty of Paying Living Expenses:   Food Insecurity:   .  Worried About Charity fundraiser in the Last Year:   . Arboriculturist in the Last Year:   Transportation Needs:   . Film/video editor (Medical):   Marland Kitchen Lack of Transportation (Non-Medical):   Physical Activity:   . Days of Exercise per Week:   . Minutes of Exercise per Session:   Stress:   . Feeling of Stress :   Social Connections:   . Frequency of Communication with Friends and Family:   . Frequency of Social Gatherings with Friends and Family:   . Attends Religious Services:   . Active Member of Clubs or Organizations:   . Attends Archivist Meetings:   Marland Kitchen Marital Status:   Intimate Partner Violence:   . Fear of Current or Ex-Partner:   . Emotionally Abused:   Marland Kitchen Physically Abused:   . Sexually Abused:     Past Surgical History:  Procedure Laterality Date  . BUNIONECTOMY  ?2000   bunion removed from both feet  . cataract surger/lens implant  2000   right eye  . Cataract surgery, lens implant and retinal repain  2005   left eye    Family History  Problem Relation Age of  Onset  . Heart disease Mother        Pacemaker at age 57  . Hypertension Mother   . Heart disease Father        died at 1 from MI, he had first heart attack at 21  . Stroke Maternal Aunt   . Heart disease Unknown        died at 30 from heart attack (Brother's son)  . Heart attack Maternal Grandmother   . Cancer Brother        lymphoma    Allergies  Allergen Reactions  . Lisinopril     Hyperkalemia     Current Outpatient Medications on File Prior to Visit  Medication Sig Dispense Refill  . amLODipine (NORVASC) 5 MG tablet TAKE 1 TABLET BY MOUTH ONCE DAILY 90 tablet 1  . aspirin 81 MG tablet Take 81 mg by mouth daily.    Marland Kitchen atorvastatin (LIPITOR) 40 MG tablet TAKE 1 TABLET BY MOUTH ONCE DAILY 90 tablet 1  . betamethasone dipropionate 0.05 % cream Apply topically 2 (two) times daily. 30 g 0  . Calcium Carbonate-Vitamin D (CALTRATE 600+D) 600-400 MG-UNIT per tablet Take 1 tablet  by mouth 2 (two) times daily.    . cholecalciferol (VITAMIN D) 1000 UNITS tablet Take 3,000 Units by mouth daily.    . meloxicam (MOBIC) 7.5 MG tablet TAKE 1 TABLET BY MOUTH ONCE DAILY 90 tablet 1  . omeprazole (PRILOSEC) 40 MG capsule Take 1 capsule (40 mg total) by mouth daily. 30 capsule 3  . timolol (BETIMOL) 0.25 % ophthalmic solution Place 1 drop into both eyes daily.    . vitamin C (ASCORBIC ACID) 500 MG tablet Take 500 mg by mouth every morning.     No current facility-administered medications on file prior to visit.    BP (!) 169/85 (BP Location: Right Arm, Patient Position: Sitting, Cuff Size: Normal)   Pulse 74   Temp (!) 97 F (36.1 C) (Temporal)   Resp 16   Ht 5\' 2"  (1.575 m)   Wt 163 lb (73.9 kg)   LMP 02/05/1996   SpO2 97%   BMI 29.81 kg/m    Objective:   Physical Exam Constitutional:      Appearance: She is well-developed.  Neck:     Thyroid: No thyromegaly.  Cardiovascular:     Rate and Rhythm: Normal rate and regular rhythm.     Heart sounds: Normal heart sounds. No murmur.  Pulmonary:     Effort: Pulmonary effort is normal. No respiratory distress.     Breath sounds: Normal breath sounds. No wheezing.  Musculoskeletal:     Cervical back: Neck supple.  Lymphadenopathy:     Head:     Right side of head: No preauricular, posterior auricular or occipital adenopathy.     Left side of head: No preauricular, posterior auricular or occipital adenopathy.     Cervical: Cervical adenopathy (mobile, non-tender enlarged lymph node noted on right upper anterior cervical chain.  approximately 2 cm long) present.     Right cervical: No deep or posterior cervical adenopathy.    Left cervical: No deep or posterior cervical adenopathy.  Skin:    General: Skin is warm and dry.  Neurological:     Mental Status: She is alert and oriented to person, place, and time.  Psychiatric:        Behavior: Behavior normal.        Thought Content: Thought content normal.  Judgment: Judgment normal.           Assessment & Plan:  Enlarged lymph we will plan empiric treatment with Augmentin x10 days.  In 2 weeks from hydration.  If no improvement in the size of the lymph node we will plan to perform imaging of the neck.  Hypertension-this is uncontrolled.  Will increase her amlodipine from 5 mg to 10 mg once daily.  Diabetes type 2-due for follow-up A1c.  Will order today.  Hyperlipidemia-tolerating statin.  Will obtain follow-up lipid panel.  This visit occurred during the SARS-CoV-2 public health emergency.  Safety protocols were in place, including screening questions prior to the visit, additional usage of staff PPE, and extensive cleaning of exam room while observing appropriate contact time as indicated for disinfecting solutions.

## 2019-04-21 LAB — HEPATITIS C ANTIBODY
Hepatitis C Ab: NONREACTIVE
SIGNAL TO CUT-OFF: 0.02 (ref <1.00)

## 2019-05-11 ENCOUNTER — Encounter: Payer: Self-pay | Admitting: Family

## 2019-05-11 ENCOUNTER — Ambulatory Visit (INDEPENDENT_AMBULATORY_CARE_PROVIDER_SITE_OTHER): Payer: PPO | Admitting: Family

## 2019-05-11 ENCOUNTER — Other Ambulatory Visit: Payer: Self-pay | Admitting: Family

## 2019-05-11 ENCOUNTER — Other Ambulatory Visit: Payer: Self-pay

## 2019-05-11 VITALS — BP 158/78 | HR 70 | Temp 97.6°F | Resp 16 | Ht 62.0 in | Wt 164.0 lb

## 2019-05-11 DIAGNOSIS — R591 Generalized enlarged lymph nodes: Secondary | ICD-10-CM

## 2019-05-11 DIAGNOSIS — I10 Essential (primary) hypertension: Secondary | ICD-10-CM | POA: Diagnosis not present

## 2019-05-11 MED ORDER — HYDROCHLOROTHIAZIDE 25 MG PO TABS: 25.0000 mg | ORAL_TABLET | Freq: Every day | ORAL | 3 refills | Status: DC

## 2019-05-11 MED FILL — HYDROCHLOROTHIAZIDE 25 MG T: 25 | 30 days supply | Qty: 30 | Fill #0

## 2019-05-11 NOTE — Progress Notes (Signed)
Subjective:    Patient ID: Jade Gross, female    DOB: 25-Nov-1945, 74 y.o.   MRN: [id number]  HPI  Patient is a 74 yr old female who presents today for follow up.   HTN- last visit we increased amlodipine from 5mg  to 10mg . She reports good compliance with medication.   BP Readings from Last 3 Encounters:  05/11/19 (!) 158/78  04/20/19 (!) 152/76  09/10/18 (!) 155/89   Cervical Adenopathy- last visit we noted an enlarged LN.  We treated with empiric Augmentin.    Review of Systems See HPI  Past Medical History:  Diagnosis Date  . Borderline glaucoma   . Hyperlipidemia   . Hypertension      Social History   Socioeconomic History  . Marital status: Divorced    Spouse name: Not on file  . Number of children: 1  . Years of education: Not on file  . Highest education level: Not on file  Occupational History    Employer: [employer]  Tobacco Use  . Smoking status: Never Smoker  . Smokeless tobacco: Never Used  Substance and Sexual Activity  . Alcohol use: No  . Drug use: No  . Sexual activity: Not on file  Other Topics Concern  . Not on file  Social History Narrative   Caffeine use:  <1 daily   Regular exercise:  No   Never smoked   Work at [employer] as a Librarian, academic in Press photographer   Divorced   She lives with roommate   Completed 12th grade         Social Determinants of Health   Financial Resource Strain:   . Difficulty of Paying Living Expenses:   Food Insecurity:   . Worried About Charity fundraiser in the Last Year:   . Arboriculturist in the Last Year:   Transportation Needs:   . Film/video editor (Medical):   Marland Kitchen Lack of Transportation (Non-Medical):   Physical Activity:   . Days of Exercise per Week:   . Minutes of Exercise per Session:   Stress:   . Feeling of Stress :   Social Connections:   . Frequency of Communication with Friends and Family:   . Frequency of Social Gatherings with Friends and Family:   . Attends Religious Services:   .  Active Member of Clubs or Organizations:   . Attends Archivist Meetings:   Marland Kitchen Marital Status:   Intimate Partner Violence:   . Fear of Current or Ex-Partner:   . Emotionally Abused:   Marland Kitchen Physically Abused:   . Sexually Abused:     Past Surgical History:  Procedure Laterality Date  . BUNIONECTOMY  ?2000   bunion removed from both feet  . cataract surger/lens implant  2000   right eye  . Cataract surgery, lens implant and retinal repain  2005   left eye    Family History  Problem Relation Age of Onset  . Heart disease Mother        Pacemaker at age 73  . Hypertension Mother   . Heart disease Father        died at 33 from MI, he had first heart attack at 61  . Stroke Maternal Aunt   . Heart disease Unknown        died at 51 from heart attack (Brother's son)  . Heart attack Maternal Grandmother   . Cancer Brother        lymphoma  Allergies  Allergen Reactions  . Lisinopril     Hyperkalemia     Current Outpatient Medications on File Prior to Visit  Medication Sig Dispense Refill  . aspirin 81 MG tablet Take 81 mg by mouth daily.    Marland Kitchen atorvastatin (LIPITOR) 40 MG tablet TAKE 1 TABLET BY MOUTH ONCE DAILY 90 tablet 1  . betamethasone dipropionate 0.05 % cream Apply topically 2 (two) times daily. 30 g 0  . Calcium Carbonate-Vitamin D (CALTRATE 600+D) 600-400 MG-UNIT per tablet Take 1 tablet by mouth 2 (two) times daily.    . cholecalciferol (VITAMIN D) 1000 UNITS tablet Take 3,000 Units by mouth daily.    . meloxicam (MOBIC) 7.5 MG tablet TAKE 1 TABLET BY MOUTH ONCE DAILY 90 tablet 1  . omeprazole (PRILOSEC) 40 MG capsule Take 1 capsule (40 mg total) by mouth daily. 30 capsule 3  . timolol (BETIMOL) 0.25 % ophthalmic solution Place 1 drop into both eyes daily.    . vitamin C (ASCORBIC ACID) 500 MG tablet Take 500 mg by mouth every morning.     No current facility-administered medications on file prior to visit.    BP (!) 158/78 (BP Location: Right Arm,  Patient Position: Sitting, Cuff Size: Small)   Pulse 70   Temp 97.6 F (36.4 C) (Temporal)   Resp 16   Ht 5\' 2"  (1.575 m)   Wt 164 lb (74.4 kg)   LMP 02/05/1996   SpO2 99%   BMI 30.00 kg/m       Objective:   Physical Exam Constitutional:      Appearance: She is well-developed.  Neck:     Thyroid: No thyromegaly.     Comments: + enlarged right upper cervical lymph node (possibly mildly decreased in size but still feels >1 cm in diameter) Cardiovascular:     Rate and Rhythm: Normal rate and regular rhythm.     Heart sounds: Normal heart sounds. No murmur.  Pulmonary:     Effort: Pulmonary effort is normal. No respiratory distress.     Breath sounds: Normal breath sounds. No wheezing.  Musculoskeletal:     Cervical back: Neck supple.  Skin:    General: Skin is warm and dry.  Neurological:     Mental Status: She is alert and oriented to person, place, and time.  Psychiatric:        Behavior: Behavior normal.        Thought Content: Thought content normal.        Judgment: Judgment normal.           Assessment & Plan:  HTN- uncontrolled. Will add hctz once daily. Continue amlodipine.   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   This visit occurred during the SARS-CoV-2 public health emergency.  Safety protocols were in place, including screening questions prior to the visit, additional usage of staff PPE, and extensive cleaning of exam room while observing appropriate contact time as indicated for disinfecting solutions.

## 2019-05-11 NOTE — Patient Instructions (Signed)
Please add hctz once daily for blood pressure.

## 2019-05-18 ENCOUNTER — Ambulatory Visit (HOSPITAL_BASED_OUTPATIENT_CLINIC_OR_DEPARTMENT_OTHER)
Admission: RE | Admit: 2019-05-18 | Discharge: 2019-05-18 | Disposition: A | Discharge status: Home / Self Care | Payer: PPO | Source: Ambulatory Visit | Attending: Family | Admitting: Family

## 2019-05-18 ENCOUNTER — Other Ambulatory Visit: Payer: Self-pay

## 2019-05-18 ENCOUNTER — Encounter (HOSPITAL_BASED_OUTPATIENT_CLINIC_OR_DEPARTMENT_OTHER): Payer: Self-pay

## 2019-05-18 DIAGNOSIS — R591 Generalized enlarged lymph nodes: Secondary | ICD-10-CM

## 2019-05-18 DIAGNOSIS — R221 Localized swelling, mass and lump, neck: Secondary | ICD-10-CM | POA: Diagnosis not present

## 2019-05-18 MED ORDER — IOHEXOL 300 MG/ML  SOLN
100.0000 mL | Freq: Once | INTRAMUSCULAR | Status: AC | PRN
  Administered 2019-05-18: 75 mL via INTRAVENOUS

## 2019-05-19 ENCOUNTER — Encounter: Payer: Self-pay | Admitting: Family

## 2019-05-25 ENCOUNTER — Other Ambulatory Visit: Payer: Self-pay | Admitting: Family

## 2019-05-25 ENCOUNTER — Other Ambulatory Visit: Payer: Self-pay

## 2019-05-25 ENCOUNTER — Ambulatory Visit (INDEPENDENT_AMBULATORY_CARE_PROVIDER_SITE_OTHER): Payer: PPO | Admitting: Family

## 2019-05-25 ENCOUNTER — Encounter: Payer: Self-pay | Admitting: Family

## 2019-05-25 VITALS — BP 132/72 | HR 73 | Temp 97.3°F | Resp 16 | Ht 62.0 in | Wt 159.0 lb

## 2019-05-25 DIAGNOSIS — I10 Essential (primary) hypertension: Secondary | ICD-10-CM | POA: Diagnosis not present

## 2019-05-25 LAB — BASIC METABOLIC PANEL
BUN: 24 mg/dL — ABNORMAL HIGH (ref 6–23)
CO2: 34 mEq/L — ABNORMAL HIGH (ref 19–32)
Calcium: 9.7 mg/dL (ref 8.4–10.5)
Chloride: 94 mEq/L — ABNORMAL LOW (ref 96–112)
Creatinine, Ser: 0.92 mg/dL (ref 0.40–1.20)
GFR: 59.79 mL/min — ABNORMAL LOW (ref >60.00)
Glucose, Bld: 109 mg/dL — ABNORMAL HIGH (ref 70–99)
Potassium: 3.4 mEq/L — ABNORMAL LOW (ref 3.5–5.1)
Sodium: 137 mEq/L (ref 135–145)

## 2019-05-25 MED ORDER — AMLODIPINE BESYLATE 10 MG PO TABS: 10.0000 mg | ORAL_TABLET | Freq: Every day | ORAL | 1 refills | Status: DC

## 2019-05-25 MED FILL — AMLODIPINE BESYLATE 10 MG T: 10 | 90 days supply | Qty: 90 | Fill #0

## 2019-05-25 NOTE — Patient Instructions (Signed)
Continue current blood pressure medications 

## 2019-05-25 NOTE — Progress Notes (Signed)
Subjective:    Patient ID: Jade Gross, female    DOB: 10-21-1945, 74 y.o.   MRN: [id number]  HPI  Patient is a 74 yr old female who presents today for follow up.  HTN- last visit we added hctz to her regimen.   BP Readings from Last 3 Encounters:  05/25/19 132/72  05/11/19 (!) 158/78  04/20/19 (!) 152/76   Notes that she has felt a bit anxious the last 2 days.  Has a lot of stress and worry about her daughter who has a history of brain tumor.    Review of Systems    see HPI  Past Medical History:  Diagnosis Date  . Borderline glaucoma   . Hyperlipidemia   . Hypertension      Social History   Socioeconomic History  . Marital status: Divorced    Spouse name: Not on file  . Number of children: 1  . Years of education: Not on file  . Highest education level: Not on file  Occupational History    Employer:   Tobacco Use  . Smoking status: Never Smoker  . Smokeless tobacco: Never Used  Substance and Sexual Activity  . Alcohol use: No  . Drug use: No  . Sexual activity: Not on file  Other Topics Concern  . Not on file  Social History Narrative   Caffeine use:  <1 daily   Regular exercise:  No   Never smoked   Work at [employer] as a Librarian, academic in Press photographer   Divorced   She lives with roommate   Completed 12th grade         Social Determinants of Health   Financial Resource Strain:   . Difficulty of Paying Living Expenses:   Food Insecurity:   . Worried About Charity fundraiser in the Last Year:   . Arboriculturist in the Last Year:   Transportation Needs:   . Film/video editor (Medical):   Marland Kitchen Lack of Transportation (Non-Medical):   Physical Activity:   . Days of Exercise per Week:   . Minutes of Exercise per Session:   Stress:   . Feeling of Stress :   Social Connections:   . Frequency of Communication with Friends and Family:   . Frequency of Social Gatherings with Friends and Family:   . Attends Religious Services:   . Active  Member of Clubs or Organizations:   . Attends Archivist Meetings:   Marland Kitchen Marital Status:   Intimate Partner Violence:   . Fear of Current or Ex-Partner:   . Emotionally Abused:   Marland Kitchen Physically Abused:   . Sexually Abused:     Past Surgical History:  Procedure Laterality Date  . BUNIONECTOMY  ?2000   bunion removed from both feet  . cataract surger/lens implant  2000   right eye  . Cataract surgery, lens implant and retinal repain  2005   left eye    Family History  Problem Relation Age of Onset  . Heart disease Mother        Pacemaker at age 40  . Hypertension Mother   . Heart disease Father        died at 69 from MI, he had first heart attack at 108  . Stroke Maternal Aunt   . Heart disease Unknown        died at 58 from heart attack (Brother's son)  . Heart attack Maternal Grandmother   . Cancer Brother  lymphoma    Allergies  Allergen Reactions  . Lisinopril     Hyperkalemia     Current Outpatient Medications on File Prior to Visit  Medication Sig Dispense Refill  . aspirin 81 MG tablet Take 81 mg by mouth daily.    Marland Kitchen atorvastatin (LIPITOR) 40 MG tablet TAKE 1 TABLET BY MOUTH ONCE DAILY 90 tablet 1  . betamethasone dipropionate 0.05 % cream Apply topically 2 (two) times daily. 30 g 0  . Calcium Carbonate-Vitamin D (CALTRATE 600+D) 600-400 MG-UNIT per tablet Take 1 tablet by mouth 2 (two) times daily.    . cholecalciferol (VITAMIN D) 1000 UNITS tablet Take 3,000 Units by mouth daily.    . hydrochlorothiazide (HYDRODIURIL) 25 MG tablet Take 1 tablet (25 mg total) by mouth daily. 30 tablet 3  . meloxicam (MOBIC) 7.5 MG tablet TAKE 1 TABLET BY MOUTH ONCE DAILY 90 tablet 1  . omeprazole (PRILOSEC) 40 MG capsule Take 1 capsule (40 mg total) by mouth daily. 30 capsule 3  . timolol (BETIMOL) 0.25 % ophthalmic solution Place 1 drop into both eyes daily.    . vitamin C (ASCORBIC ACID) 500 MG tablet Take 500 mg by mouth every morning.     No current  facility-administered medications on file prior to visit.    BP 132/72 (BP Location: Left Arm, Patient Position: Sitting, Cuff Size: Normal)   Pulse 73   Temp (!) 97.3 F (36.3 C) (Temporal)   Resp 16   Ht 5\' 2"  (1.575 m)   Wt 159 lb (72.1 kg)   LMP 02/05/1996   SpO2 97%   BMI 29.08 kg/m    Objective:   Physical Exam Constitutional:      Appearance: She is well-developed.  Neck:     Thyroid: No thyromegaly.  Cardiovascular:     Rate and Rhythm: Normal rate and regular rhythm.     Heart sounds: Normal heart sounds. No murmur.  Pulmonary:     Effort: Pulmonary effort is normal. No respiratory distress.     Breath sounds: Normal breath sounds. No wheezing.  Musculoskeletal:     Cervical back: Neck supple.  Skin:    General: Skin is warm and dry.  Neurological:     Mental Status: She is alert and oriented to person, place, and time.  Psychiatric:        Behavior: Behavior normal.        Thought Content: Thought content normal.        Judgment: Judgment normal.           Assessment & Plan:  HTN- bp stable/improved on current meds. Will obtain follow up bmet.  She is advised to monitor her blood pressure at home and let me know if SBP running >150.  This visit occurred during the SARS-CoV-2 public health emergency.  Safety protocols were in place, including screening questions prior to the visit, additional usage of staff PPE, and extensive cleaning of exam room while observing appropriate contact time as indicated for disinfecting solutions.

## 2019-05-26 ENCOUNTER — Telehealth: Payer: Self-pay | Admitting: Family

## 2019-05-26 DIAGNOSIS — E876 Hypokalemia: Secondary | ICD-10-CM

## 2019-05-26 MED ORDER — POTASSIUM CHLORIDE CRYS ER 20 MEQ PO TBCR: 20.0000 meq | EXTENDED_RELEASE_TABLET | Freq: Every day | ORAL | 3 refills | Status: DC

## 2019-05-26 MED FILL — POTASSIUM CHLORIDE CRYS ER: 20 | 30 days supply | Qty: 30 | Fill #0

## 2019-05-26 NOTE — Telephone Encounter (Signed)
Lvm  for patient to call back about results. 

## 2019-05-26 NOTE — Telephone Encounter (Signed)
Please advise pt that potassium is a little low. I would like her to add kdur once daily and be sure to stay well hydrated. Repeat bmet in 1 week.  Dx hypokalemia.

## 2019-05-30 NOTE — Telephone Encounter (Signed)
Opened in error

## 2019-06-01 NOTE — Addendum Note (Signed)
Addended by: Kelle Darting A on: 06/01/2019 08:46 AM   Modules accepted: Orders

## 2019-06-01 NOTE — Telephone Encounter (Signed)
Left detailed message on voicemail and to call if any questions and to schedule lab appt.  Also sent mychart message.

## 2019-06-03 ENCOUNTER — Other Ambulatory Visit: Payer: Self-pay

## 2019-06-03 ENCOUNTER — Other Ambulatory Visit (INDEPENDENT_AMBULATORY_CARE_PROVIDER_SITE_OTHER): Payer: PPO

## 2019-06-03 DIAGNOSIS — E876 Hypokalemia: Secondary | ICD-10-CM

## 2019-06-03 LAB — BASIC METABOLIC PANEL
BUN: 26 mg/dL — ABNORMAL HIGH (ref 6–23)
CO2: 31 mEq/L (ref 19–32)
Calcium: 9.5 mg/dL (ref 8.4–10.5)
Chloride: 101 mEq/L (ref 96–112)
Creatinine, Ser: 1 mg/dL (ref 0.40–1.20)
GFR: 54.3 mL/min — ABNORMAL LOW (ref >60.00)
Glucose, Bld: 103 mg/dL — ABNORMAL HIGH (ref 70–99)
Potassium: 4 mEq/L (ref 3.5–5.1)
Sodium: 137 mEq/L (ref 135–145)

## 2019-06-20 MED FILL — HYDROCHLOROTHIAZIDE 25 MG T: 25 | 30 days supply | Qty: 30 | Fill #1

## 2019-07-07 ENCOUNTER — Other Ambulatory Visit (HOSPITAL_BASED_OUTPATIENT_CLINIC_OR_DEPARTMENT_OTHER): Payer: Self-pay | Admitting: Optometry

## 2019-07-29 DIAGNOSIS — H40053 Ocular hypertension, bilateral: Secondary | ICD-10-CM | POA: Diagnosis not present

## 2019-07-29 DIAGNOSIS — H43813 Vitreous degeneration, bilateral: Secondary | ICD-10-CM | POA: Diagnosis not present

## 2019-07-29 LAB — HM DIABETES EYE EXAM

## 2019-08-25 ENCOUNTER — Other Ambulatory Visit: Payer: Self-pay | Admitting: Family

## 2019-08-25 MED ORDER — OMEPRAZOLE 40 MG PO CPDR: 40.0000 mg | DELAYED_RELEASE_CAPSULE | Freq: Every day | ORAL | 3 refills | Status: DC

## 2019-08-25 MED FILL — OMEPRAZOLE 40 MG CPDR: 40 | 90 days supply | Qty: 90 | Fill #0

## 2019-09-30 MED FILL — ATORVASTATIN CALCIUM 40 MG: 40 | 90 days supply | Qty: 90 | Fill #1

## 2019-09-30 MED FILL — HYDROCHLOROTHIAZIDE 25 MG T: 25 | 30 days supply | Qty: 30 | Fill #2

## 2019-11-15 MED FILL — TIMOLOL 0.25% EYE DROPS: 0.25 | 90 days supply | Qty: 10 | Fill #1

## 2019-11-15 MED FILL — HYDROCHLOROTHIAZIDE 25 MG T: 25 | 30 days supply | Qty: 30 | Fill #3

## 2019-11-15 MED FILL — AMLODIPINE BESYLATE 10 MG T: 10 | 90 days supply | Qty: 90 | Fill #1

## 2019-11-29 ENCOUNTER — Ambulatory Visit: Payer: PPO | Admitting: Family

## 2019-12-21 ENCOUNTER — Other Ambulatory Visit: Payer: Self-pay | Admitting: Family

## 2019-12-21 MED FILL — OMEPRAZOLE 40 MG CPDR: 40 | 90 days supply | Qty: 90 | Fill #1

## 2019-12-21 MED FILL — HYDROCHLOROTHIAZIDE 25 MG T: 25 | 30 days supply | Qty: 30 | Fill #0

## 2020-04-23 ENCOUNTER — Other Ambulatory Visit (HOSPITAL_BASED_OUTPATIENT_CLINIC_OR_DEPARTMENT_OTHER): Payer: Self-pay | Admitting: Optometry

## 2020-04-23 MED FILL — TIMOLOL 0.25% EYE DROPS: 0.25 | 90 days supply | Qty: 10 | Fill #0

## 2020-04-27 DIAGNOSIS — H40053 Ocular hypertension, bilateral: Secondary | ICD-10-CM | POA: Diagnosis not present

## 2020-05-09 ENCOUNTER — Other Ambulatory Visit (HOSPITAL_BASED_OUTPATIENT_CLINIC_OR_DEPARTMENT_OTHER): Payer: Self-pay

## 2020-05-09 ENCOUNTER — Other Ambulatory Visit: Payer: Self-pay | Admitting: Family

## 2020-05-22 ENCOUNTER — Ambulatory Visit (INDEPENDENT_AMBULATORY_CARE_PROVIDER_SITE_OTHER): Payer: Medicare Other | Admitting: Family

## 2020-05-22 ENCOUNTER — Telehealth: Payer: Self-pay | Admitting: Family

## 2020-05-22 ENCOUNTER — Other Ambulatory Visit: Payer: Self-pay

## 2020-05-22 ENCOUNTER — Other Ambulatory Visit (HOSPITAL_BASED_OUTPATIENT_CLINIC_OR_DEPARTMENT_OTHER): Payer: Self-pay

## 2020-05-22 VITALS — BP 137/82 | HR 70 | Temp 98.3°F | Resp 18 | Ht 62.0 in | Wt 153.0 lb

## 2020-05-22 DIAGNOSIS — Z0000 Encounter for general adult medical examination without abnormal findings: Secondary | ICD-10-CM

## 2020-05-22 DIAGNOSIS — E119 Type 2 diabetes mellitus without complications: Secondary | ICD-10-CM

## 2020-05-22 DIAGNOSIS — I10 Essential (primary) hypertension: Secondary | ICD-10-CM

## 2020-05-22 DIAGNOSIS — E785 Hyperlipidemia, unspecified: Secondary | ICD-10-CM

## 2020-05-22 LAB — COMPREHENSIVE METABOLIC PANEL
ALT: 8 U/L (ref 0–35)
AST: 13 U/L (ref 0–37)
Albumin: 4.5 g/dL (ref 3.5–5.2)
Alkaline Phosphatase: 119 U/L — ABNORMAL HIGH (ref 39–117)
BUN: 18 mg/dL (ref 6–23)
CO2: 33 mEq/L — ABNORMAL HIGH (ref 19–32)
Calcium: 10.3 mg/dL (ref 8.4–10.5)
Chloride: 95 mEq/L — ABNORMAL LOW (ref 96–112)
Creatinine, Ser: 0.91 mg/dL (ref 0.40–1.20)
GFR: 62.25 mL/min (ref >60.00)
Glucose, Bld: 100 mg/dL — ABNORMAL HIGH (ref 70–99)
Potassium: 3.7 mEq/L (ref 3.5–5.1)
Sodium: 137 mEq/L (ref 135–145)
Total Bilirubin: 0.6 mg/dL (ref 0.2–1.2)
Total Protein: 7.8 g/dL (ref 6.0–8.3)

## 2020-05-22 LAB — LIPID PANEL
Cholesterol: 268 mg/dL — ABNORMAL HIGH (ref 0–200)
HDL: 48.1 mg/dL (ref >39.00)
LDL Cholesterol: 185 mg/dL — ABNORMAL HIGH (ref 0–99)
NonHDL: 220.27
Total CHOL/HDL Ratio: 6
Triglycerides: 177 mg/dL — ABNORMAL HIGH (ref 0.0–149.0)
VLDL: 35.4 mg/dL (ref 0.0–40.0)

## 2020-05-22 LAB — MICROALBUMIN / CREATININE URINE RATIO
Creatinine,U: 63.6 mg/dL
Microalb Creat Ratio: 1.1 mg/g (ref 0.0–30.0)
Microalb, Ur: <0.7 mg/dL (ref 0.0–1.9)

## 2020-05-22 LAB — HEMOGLOBIN A1C: Hgb A1c MFr Bld: 6.1 % (ref 4.6–6.5)

## 2020-05-22 MED ORDER — OMEPRAZOLE 40 MG PO CPDR
DELAYED_RELEASE_CAPSULE | Freq: Every day | ORAL | 1 refills | Status: DC
  Filled 2020-05-22: qty 90, 90d supply, fill #0
  Filled 2020-08-23: qty 90, 90d supply, fill #1

## 2020-05-22 MED ORDER — ATORVASTATIN CALCIUM 40 MG PO TABS
1.0000 | ORAL_TABLET | Freq: Every day | ORAL | 1 refills | Status: DC
  Filled 2020-05-22: qty 90, 90d supply, fill #0
  Filled 2020-08-23: qty 90, 90d supply, fill #1

## 2020-05-22 MED ORDER — AMLODIPINE BESYLATE 10 MG PO TABS
ORAL_TABLET | Freq: Every day | ORAL | 1 refills | Status: DC
  Filled 2020-05-22: qty 90, 90d supply, fill #0
  Filled 2020-08-23: qty 90, 90d supply, fill #1

## 2020-05-22 MED ORDER — HYDROCHLOROTHIAZIDE 25 MG PO TABS
ORAL_TABLET | ORAL | 1 refills | Status: DC
  Filled 2020-05-22: qty 90, 90d supply, fill #0
  Filled 2020-08-23: qty 90, 90d supply, fill #1

## 2020-05-22 MED ORDER — POTASSIUM CHLORIDE CRYS ER 20 MEQ PO TBCR
20.0000 meq | EXTENDED_RELEASE_TABLET | Freq: Every day | ORAL | 1 refills | Status: DC
  Filled 2020-05-22: qty 90, 90d supply, fill #0
  Filled 2020-08-23: qty 90, 90d supply, fill #1

## 2020-05-22 NOTE — Telephone Encounter (Signed)
Fax sent for recs.

## 2020-05-22 NOTE — Patient Instructions (Signed)
Please complete lab work. Please get your covid booster.

## 2020-05-22 NOTE — Telephone Encounter (Signed)
Please call Dr. Macie Burows and request copy of DM eye exame.

## 2020-05-22 NOTE — Progress Notes (Signed)
   Subjective:    Patient ID: Jade Gross, female    DOB: 1945/04/21, 75 y.o.   MRN: [id number]  HPI  Pt presents today for follow up.   DM2- diet controlled Lab Results  Component Value Date   HGBA1C 6.3 04/20/2019   HGBA1C 6.5 09/10/2018   HGBA1C 6.6 (H) 08/26/2017   Lab Results  Component Value Date   MICROALBUR 0.9 09/10/2018   LDLCALC 85 04/20/2019   CREATININE 1.00 06/03/2019   Hyperlipidemia- atorvastatin 40mg .  Lab Results  Component Value Date   CHOL 153 04/20/2019   HDL 43.90 04/20/2019   LDLCALC 85 04/20/2019   TRIG 117.0 04/20/2019   CHOLHDL 3 04/20/2019   Vit D deficiency- continues 3000 iu once daily of vit D otc.   GERD- continues prilosec 40mg .  Notes occasional symptoms- overall better.   HTN- on amlodipine 10mg , and hct 25mg .  BP Readings from Last 3 Encounters:  05/22/20 137/82  05/25/19 132/72  05/11/19 (!) 158/78    Review of Systems     Objective:   Physical Exam Constitutional:      Appearance: She is well-developed.  Cardiovascular:     Rate and Rhythm: Normal rate and regular rhythm.     Heart sounds: Normal heart sounds. No murmur heard.   Pulmonary:     Effort: Pulmonary effort is normal. No respiratory distress.     Breath sounds: Normal breath sounds. No wheezing.  Psychiatric:        Behavior: Behavior normal.        Thought Content: Thought content normal.        Judgment: Judgment normal.           Assessment & Plan:  DM2- clinically stable. Continue diabetic diet. Obtain follow up A1C.  Hyperlipidemia- tolerating atorvastatin 40mg  once daily- continue same. Obtain follow up lipid panel.  HTN- bp stable on hctz 25mg  and amlodipine 5mg . Continue same.  GERD- stable on PPI, continue omeprazole 40mg  once daily. Discussed GERD diet.  Pt declines colonoscopy and cologuard at this time.   This visit occurred during the SARS-CoV-2 public health emergency.  Safety protocols were in place, including screening  questions prior to the visit, additional usage of staff PPE, and extensive cleaning of exam room while observing appropriate contact time as indicated for disinfecting solutions.      Hyperlipidemia

## 2020-05-23 ENCOUNTER — Telehealth: Payer: Self-pay | Admitting: Family

## 2020-05-23 NOTE — Telephone Encounter (Signed)
Patient advised or results, she reports she was off the current ose of lipitor for a period of 2 to 4 weeks (not sure ho long). She will start taking daily as prescribe.  Please advise if ok.

## 2020-05-23 NOTE — Telephone Encounter (Signed)
Please call pt and let her know that cholesterol is >100 points higher than last year.  Has she missed doses of lipitor? If so, please restart. If she has been taking lipitor regularly we will need to increase her dose.   A1C is stable at 6.1.

## 2020-05-24 NOTE — Telephone Encounter (Signed)
Please resume current dose.

## 2020-05-24 NOTE — Telephone Encounter (Signed)
Left detailed message on machine to resume current dose.

## 2020-08-09 ENCOUNTER — Other Ambulatory Visit (HOSPITAL_BASED_OUTPATIENT_CLINIC_OR_DEPARTMENT_OTHER): Payer: Self-pay

## 2020-08-09 MED FILL — Timolol Maleate Ophth Soln 0.25%: OPHTHALMIC | Qty: 10 | Fill #0 | Status: CN

## 2020-08-10 ENCOUNTER — Other Ambulatory Visit (HOSPITAL_BASED_OUTPATIENT_CLINIC_OR_DEPARTMENT_OTHER): Payer: Self-pay

## 2020-08-10 MED ORDER — TIMOLOL MALEATE 0.25 % OP SOLN
OPHTHALMIC | 3 refills | Status: DC
  Filled 2020-08-10: qty 10, 90d supply, fill #0

## 2020-08-24 ENCOUNTER — Other Ambulatory Visit (HOSPITAL_BASED_OUTPATIENT_CLINIC_OR_DEPARTMENT_OTHER): Payer: Self-pay

## 2020-09-26 ENCOUNTER — Ambulatory Visit: Payer: Medicare Other | Admitting: Family

## 2020-09-28 ENCOUNTER — Other Ambulatory Visit (HOSPITAL_BASED_OUTPATIENT_CLINIC_OR_DEPARTMENT_OTHER): Payer: Self-pay

## 2020-09-28 ENCOUNTER — Encounter: Payer: Self-pay | Admitting: Family

## 2020-09-28 ENCOUNTER — Ambulatory Visit (INDEPENDENT_AMBULATORY_CARE_PROVIDER_SITE_OTHER): Payer: Medicare Other | Admitting: Family

## 2020-09-28 ENCOUNTER — Other Ambulatory Visit: Payer: Self-pay

## 2020-09-28 VITALS — BP 132/60 | HR 67 | Temp 98.1°F | Ht 61.0 in | Wt 161.2 lb

## 2020-09-28 DIAGNOSIS — E119 Type 2 diabetes mellitus without complications: Secondary | ICD-10-CM | POA: Diagnosis not present

## 2020-09-28 DIAGNOSIS — Z23 Encounter for immunization: Secondary | ICD-10-CM | POA: Diagnosis not present

## 2020-09-28 DIAGNOSIS — E785 Hyperlipidemia, unspecified: Secondary | ICD-10-CM | POA: Diagnosis not present

## 2020-09-28 DIAGNOSIS — L989 Disorder of the skin and subcutaneous tissue, unspecified: Secondary | ICD-10-CM | POA: Diagnosis not present

## 2020-09-28 DIAGNOSIS — L309 Dermatitis, unspecified: Secondary | ICD-10-CM | POA: Diagnosis not present

## 2020-09-28 DIAGNOSIS — Z1231 Encounter for screening mammogram for malignant neoplasm of breast: Secondary | ICD-10-CM | POA: Diagnosis not present

## 2020-09-28 DIAGNOSIS — I10 Essential (primary) hypertension: Secondary | ICD-10-CM | POA: Diagnosis not present

## 2020-09-28 LAB — COMPREHENSIVE METABOLIC PANEL
ALT: 12 U/L (ref 0–35)
AST: 17 U/L (ref 0–37)
Albumin: 4.3 g/dL (ref 3.5–5.2)
Alkaline Phosphatase: 108 U/L (ref 39–117)
BUN: 19 mg/dL (ref 6–23)
CO2: 30 mEq/L (ref 19–32)
Calcium: 9.8 mg/dL (ref 8.4–10.5)
Chloride: 101 mEq/L (ref 96–112)
Creatinine, Ser: 0.87 mg/dL (ref 0.40–1.20)
GFR: 65.54 mL/min (ref >60.00)
Glucose, Bld: 98 mg/dL (ref 70–99)
Potassium: 4.1 mEq/L (ref 3.5–5.1)
Sodium: 140 mEq/L (ref 135–145)
Total Bilirubin: 0.3 mg/dL (ref 0.2–1.2)
Total Protein: 7.2 g/dL (ref 6.0–8.3)

## 2020-09-28 LAB — LIPID PANEL
Cholesterol: 152 mg/dL (ref 0–200)
HDL: 42.7 mg/dL (ref >39.00)
LDL Cholesterol: 89 mg/dL (ref 0–99)
NonHDL: 109.25
Total CHOL/HDL Ratio: 4
Triglycerides: 99 mg/dL (ref 0.0–149.0)
VLDL: 19.8 mg/dL (ref 0.0–40.0)

## 2020-09-28 LAB — HEMOGLOBIN A1C: Hgb A1c MFr Bld: 6.5 % (ref 4.6–6.5)

## 2020-09-28 MED ORDER — BETAMETHASONE DIPROPIONATE 0.05 % EX CREA
TOPICAL_CREAM | Freq: Two times a day (BID) | CUTANEOUS | 0 refills | Status: AC
  Filled 2020-09-28 – 2021-06-07 (×2): qty 30, 15d supply, fill #0

## 2020-09-28 NOTE — Assessment & Plan Note (Signed)
Lab Results  Component Value Date   CHOL 268 (H) 05/22/2020   HDL 48.10 05/22/2020   LDLCALC 185 (H) 05/22/2020   TRIG 177.0 (H) 05/22/2020   CHOLHDL 6 05/22/2020   Currently on atorvastatin '40mg'$ . Repeat Lipid panel .

## 2020-09-28 NOTE — Assessment & Plan Note (Signed)
Recommend removal of suspicious appearing skin lesion. She will schedule a visit at her convenience.

## 2020-09-28 NOTE — Assessment & Plan Note (Signed)
New. At the base of her scalp. Will rx betamethasone.

## 2020-09-28 NOTE — Patient Instructions (Signed)
Please complete lab work prior to leaving.   

## 2020-09-28 NOTE — Progress Notes (Signed)
Subjective:   By signing my name below, I, Shehryar Baig, attest that this documentation has been prepared under the direction and in the presence of Debbrah Alar NP. 09/28/2020    Patient ID: Jade Gross, female    DOB: 1945-10-17, 75 y.o.   MRN: [id number]  Chief Complaint  Patient presents with   Medical Management of Chronic Issues    F/u and med refills   Rash    On back of neck, worse the last 6 m     HPI Patient is in today for a office visit.  Rash- She complains of a itchy, dry rash on the back of her neck.  She also reports having discolored spot on her left ankle. She notes that it has been there her entire life.  Cholesterol- During last visit she was not taking 40 mg atorvastatin and her cholesterol increased. She started taking 40 mg atorvastatin again and is getting her cholesterol checked during this visit.  Lab Results  Component Value Date   CHOL 268 (H) 05/22/2020   HDL 48.10 05/22/2020   LDLCALC 185 (H) 05/22/2020   TRIG 177.0 (H) 05/22/2020   CHOLHDL 6 05/22/2020    Blood sugar- She is planning to improved her diet to control her blood sugars.   Lab Results  Component Value Date   HGBA1C 6.1 05/22/2020   Mammogram- She is planning to make an appointment to get it completed. Immunizations- She is eligible for the shingrix vaccine and is not interested in getting it. She is willing to get her flu vaccine at her work place. She is eligible for a pneumonia booster vaccine and is willing to get it during this visit. Vision- She had difficulty making an appointment in a reasonable time with her ophthalmologist and is now considering switching her providers. She is continues taking 0.25% timolol and reports no new issues while taking it.   Health Maintenance Due  Topic Date Due   PNA vac Low Risk Adult (2 of 2 - PCV13) 03/04/2012   MAMMOGRAM  08/27/2017   COVID-19 Vaccine (4 - Booster for Pfizer series) 03/20/2020   OPHTHALMOLOGY EXAM  07/28/2020    INFLUENZA VACCINE  08/27/2020    Past Medical History:  Diagnosis Date   Borderline glaucoma    Eye problem    Hyperlipidemia    Hypertension     Past Surgical History:  Procedure Laterality Date   BUNIONECTOMY  ?2000   bunion removed from both feet   cataract surger/lens implant  2000   right eye   Cataract surgery, lens implant and retinal repain  2005   left eye    Family History  Problem Relation Age of Onset   Heart disease Mother        Pacemaker at age 9   Hypertension Mother    Heart disease Father        died at 59 from MI, he had first heart attack at 74   Stroke Maternal Aunt    Heart attack Maternal Grandmother    Cancer Brother        lymphoma   Heart disease Other        died at 8 from heart attack (Brother's son)   Brain cancer Daughter     Social History   Socioeconomic History   Marital status: Divorced    Spouse name: Not on file   Number of children: 1   Years of education: Not on file   Highest education level:  Not on file  Occupational History    Employer: [employer]  Tobacco Use   Smoking status: Never   Smokeless tobacco: Never  Substance and Sexual Activity   Alcohol use: No   Drug use: No   Sexual activity: Not on file  Other Topics Concern   Not on file  Social History Narrative   Caffeine use:  <1 daily   Regular exercise:  No   Never smoked   Work at [employer] as a Librarian, academic in Press photographer   Divorced   She lives with roommate   Completed 12th grade         Social Determinants of Health   Financial Resource Strain: Not on file  Food Insecurity: Not on file  Transportation Needs: Not on file  Physical Activity: Not on file  Stress: Not on file  Social Connections: Not on file  Intimate Partner Violence: Not on file    Outpatient Medications Prior to Visit  Medication Sig Dispense Refill   amLODipine (NORVASC) 10 MG tablet TAKE 1 TABLET (10 MG TOTAL) BY MOUTH DAILY. 90 tablet 1   aspirin 81 MG tablet Take 81 mg by  mouth daily.     atorvastatin (LIPITOR) 40 MG tablet Take 1 tablet (40 mg total) by mouth daily. 90 tablet 1   Calcium Carbonate-Vitamin D 600-400 MG-UNIT tablet Take 1 tablet by mouth 2 (two) times daily.     cholecalciferol (VITAMIN D) 1000 UNITS tablet Take 3,000 Units by mouth daily.     hydrochlorothiazide (HYDRODIURIL) 25 MG tablet TAKE 1 TABLET (25 MG TOTAL) BY MOUTH DAILY. **OVERDUE FOR OFFICE VISIT** 90 tablet 1   meloxicam (MOBIC) 7.5 MG tablet TAKE 1 TABLET BY MOUTH ONCE DAILY 90 tablet 1   omeprazole (PRILOSEC) 40 MG capsule TAKE 1 CAPSULE (40 MG TOTAL) BY MOUTH DAILY. 90 capsule 1   potassium chloride SA (KLOR-CON) 20 MEQ tablet Take 1 tablet (20 mEq total) by mouth daily. 90 tablet 1   timolol (TIMOPTIC) 0.25 % ophthalmic solution INSTILL 1 DROP INTO BOTH EYES EVERY MORNING 10 mL 3   vitamin C (ASCORBIC ACID) 500 MG tablet Take 500 mg by mouth every morning.     betamethasone dipropionate 0.05 % cream Apply topically 2 (two) times daily. 30 g 0   timolol (TIMOPTIC) 0.25 % ophthalmic solution INSTILL 1 DROP INTO BOTH EYES EVERY MORNING 10 mL 3   No facility-administered medications prior to visit.    Allergies  Allergen Reactions   Lisinopril     Hyperkalemia     Review of Systems  Skin:  Positive for itching (rash on back of neck) and rash (back of the neck).       (+)discolored spot on left medial ankle      Objective:    Physical Exam Constitutional:      General: She is not in acute distress.    Appearance: Normal appearance. She is not ill-appearing.  HENT:     Head: Normocephalic and atraumatic.     Right Ear: External ear normal.     Left Ear: External ear normal.  Eyes:     Extraocular Movements: Extraocular movements intact.     Pupils: Pupils are equal, round, and reactive to light.  Cardiovascular:     Rate and Rhythm: Normal rate and regular rhythm.     Pulses: Normal pulses.     Heart sounds: Normal heart sounds. No murmur heard.   No gallop.   Pulmonary:     Effort:  Pulmonary effort is normal. No respiratory distress.     Breath sounds: Normal breath sounds. No wheezing or rales.  Skin:    General: Skin is warm and dry.     Comments: Raised, dry lesion left lower leg above ankle with erythematous base. Erythematous patch on base of occipital hairline  Neurological:     Mental Status: She is alert and oriented to person, place, and time.  Psychiatric:        Behavior: Behavior normal.        Judgment: Judgment normal.    BP 132/60   Pulse 67   Temp 98.1 F (36.7 C) (Oral)   Ht $R'5\' 1"'aU$  (1.549 m)   Wt 161 lb 3.2 oz (73.1 kg)   LMP 02/05/1996   SpO2 97%   BMI 30.46 kg/m  Wt Readings from Last 3 Encounters:  09/28/20 161 lb 3.2 oz (73.1 kg)  05/22/20 153 lb (69.4 kg)  05/25/19 159 lb (72.1 kg)       Assessment & Plan:   Problem List Items Addressed This Visit       Unprioritized   Skin lesion    Recommend removal of suspicious appearing skin lesion. She will schedule a visit at her convenience.       HYPERTENSION, BENIGN SYSTEMIC    BP Readings from Last 3 Encounters:  09/28/20 132/60  05/22/20 137/82  05/25/19 132/72  BP stable and at goal. On amlodipine $RemoveBefor'10mg'nbkdzmIkartE$  and on hctz $Remov'25mg'EzDfKs$        Hyperlipidemia    Lab Results  Component Value Date   CHOL 268 (H) 05/22/2020   HDL 48.10 05/22/2020   LDLCALC 185 (H) 05/22/2020   TRIG 177.0 (H) 05/22/2020   CHOLHDL 6 05/22/2020  Currently on atorvastatin $RemoveBeforeD'40mg'UEhWTPibUwouDv$ . Repeat Lipid panel .       Relevant Orders   Lipid panel   Eczema    New. At the base of her scalp. Will rx betamethasone.       Diabetes type 2, controlled ([place])    Lab Results  Component Value Date   HGBA1C 6.1 05/22/2020   HGBA1C 6.3 04/20/2019   HGBA1C 6.5 09/10/2018   Lab Results  Component Value Date   MICROALBUR <0.7 05/22/2020   LDLCALC 185 (H) 05/22/2020   CREATININE 0.91 05/22/2020  Diet controlled thus far. Obtain A1C. Further recommendations pending review of this result.         Relevant Orders   Hemoglobin A1c   Comp Met (CMET)   Other Visit Diagnoses     Encounter for screening mammogram for malignant neoplasm of breast    -  Primary   Relevant Orders   MM 3D SCREEN BREAST BILATERAL      Prevnar 20 today.   Meds ordered this encounter  Medications   betamethasone dipropionate 0.05 % cream    Sig: Apply on to the skin 2 (two) times daily.    Dispense:  30 g    Refill:  0    Order Specific Question:   Supervising Provider    Answer:   Penni Homans A [[id number]]    I, Debbrah Alar NP, personally preformed the services described in this documentation.  All medical record entries made by the scribe were at my direction and in my presence.  I have reviewed the chart and discharge instructions (if applicable) and agree that the record reflects my personal performance and is accurate and complete. 09/28/2020   I,Shehryar Baig,acting as a scribe for Nance Pear, NP.,have  documented all relevant documentation on the behalf of Nance Pear, NP,as directed by  Nance Pear, NP while in the presence of Nance Pear, NP.   Nance Pear, NP

## 2020-09-28 NOTE — Assessment & Plan Note (Signed)
BP Readings from Last 3 Encounters:  09/28/20 132/60  05/22/20 137/82  05/25/19 132/72   BP stable and at goal. On amlodipine '10mg'$  and on hctz '25mg'$ 

## 2020-09-28 NOTE — Assessment & Plan Note (Signed)
Lab Results  Component Value Date   HGBA1C 6.1 05/22/2020   HGBA1C 6.3 04/20/2019   HGBA1C 6.5 09/10/2018   Lab Results  Component Value Date   MICROALBUR <0.7 05/22/2020   LDLCALC 185 (H) 05/22/2020   CREATININE 0.91 05/22/2020   Diet controlled thus far. Obtain A1C. Further recommendations pending review of this result.

## 2020-10-03 ENCOUNTER — Other Ambulatory Visit (HOSPITAL_BASED_OUTPATIENT_CLINIC_OR_DEPARTMENT_OTHER): Payer: Self-pay

## 2020-10-08 DIAGNOSIS — H5212 Myopia, left eye: Secondary | ICD-10-CM | POA: Diagnosis not present

## 2020-10-08 DIAGNOSIS — H40053 Ocular hypertension, bilateral: Secondary | ICD-10-CM | POA: Diagnosis not present

## 2020-10-08 DIAGNOSIS — H5201 Hypermetropia, right eye: Secondary | ICD-10-CM | POA: Diagnosis not present

## 2020-10-08 DIAGNOSIS — H524 Presbyopia: Secondary | ICD-10-CM | POA: Diagnosis not present

## 2020-10-12 ENCOUNTER — Telehealth (HOSPITAL_BASED_OUTPATIENT_CLINIC_OR_DEPARTMENT_OTHER): Payer: Self-pay

## 2020-10-30 ENCOUNTER — Ambulatory Visit: Payer: Medicare Other | Admitting: Family

## 2020-10-31 ENCOUNTER — Telehealth (HOSPITAL_BASED_OUTPATIENT_CLINIC_OR_DEPARTMENT_OTHER): Payer: Self-pay

## 2020-12-06 ENCOUNTER — Telehealth: Payer: Self-pay | Admitting: Family

## 2020-12-06 NOTE — Telephone Encounter (Signed)
Left message for patient to call back and schedule Medicare Annual Wellness Visit (AWV) in office.  ° °If not able to come in office, please offer to do virtually or by telephone.  Left office number and my jabber #[phone]. ° °Due for AWVI ° °Please schedule at anytime with Nurse Health Advisor. °  °

## 2020-12-13 ENCOUNTER — Other Ambulatory Visit (HOSPITAL_BASED_OUTPATIENT_CLINIC_OR_DEPARTMENT_OTHER): Payer: Self-pay

## 2020-12-13 ENCOUNTER — Other Ambulatory Visit: Payer: Self-pay | Admitting: Family

## 2020-12-13 MED ORDER — ATORVASTATIN CALCIUM 40 MG PO TABS
40.0000 mg | ORAL_TABLET | Freq: Every day | ORAL | 1 refills | Status: DC
  Filled 2020-12-13: qty 90, 90d supply, fill #0
  Filled 2021-05-01: qty 90, 90d supply, fill #1

## 2020-12-13 MED ORDER — POTASSIUM CHLORIDE CRYS ER 20 MEQ PO TBCR
20.0000 meq | EXTENDED_RELEASE_TABLET | Freq: Every day | ORAL | 1 refills | Status: DC
  Filled 2020-12-13: qty 90, 90d supply, fill #0
  Filled 2021-05-01: qty 90, 90d supply, fill #1

## 2020-12-13 MED ORDER — HYDROCHLOROTHIAZIDE 25 MG PO TABS
25.0000 mg | ORAL_TABLET | Freq: Every day | ORAL | 1 refills | Status: DC
  Filled 2020-12-13: qty 90, 90d supply, fill #0
  Filled 2021-05-01: qty 90, 90d supply, fill #1

## 2020-12-13 MED ORDER — AMLODIPINE BESYLATE 10 MG PO TABS
10.0000 mg | ORAL_TABLET | Freq: Every day | ORAL | 1 refills | Status: DC
  Filled 2020-12-13: qty 90, 90d supply, fill #0
  Filled 2021-05-01: qty 90, 90d supply, fill #1

## 2020-12-13 MED ORDER — OMEPRAZOLE 40 MG PO CPDR
40.0000 mg | DELAYED_RELEASE_CAPSULE | Freq: Every day | ORAL | 1 refills | Status: DC
  Filled 2020-12-13: qty 90, 90d supply, fill #0
  Filled 2021-05-01: qty 90, 90d supply, fill #1

## 2020-12-13 MED FILL — Timolol Maleate Ophth Soln 0.25%: OPHTHALMIC | 90 days supply | Qty: 10 | Fill #0 | Status: AC

## 2021-01-14 ENCOUNTER — Telehealth: Payer: Self-pay | Admitting: Family

## 2021-01-14 NOTE — Telephone Encounter (Signed)
Left message for patient to call back and schedule Medicare Annual Wellness Visit (AWV) in office.  ° °If not able to come in office, please offer to do virtually or by telephone.  Left office number and my jabber #[phone]. ° °Due for AWVI ° °Please schedule at anytime with Nurse Health Advisor. °  °

## 2021-02-01 IMAGING — US ULTRASOUND ABDOMEN COMPLETE
1 series · 14 of 25 positions shown · non-contrast
Comparison: None.

CLINICAL DATA: Acute right upper quadrant abdominal pain.

EXAM:
ABDOMEN ULTRASOUND COMPLETE

[Series 1: ultrasound abdomen complete · 14 of 122 slices shown]
[im 1/122]
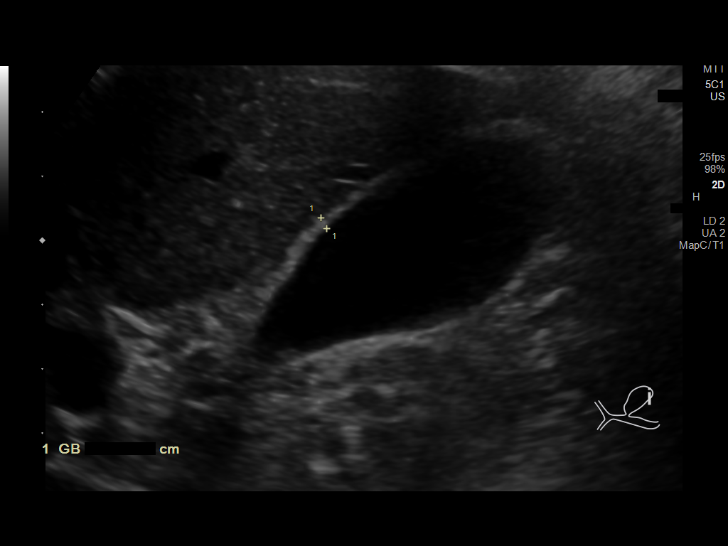
[im 11/122]
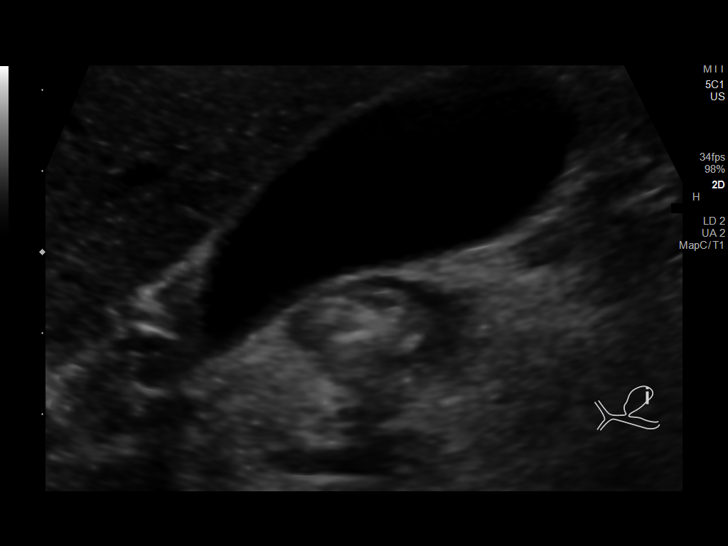
[im 21/122]
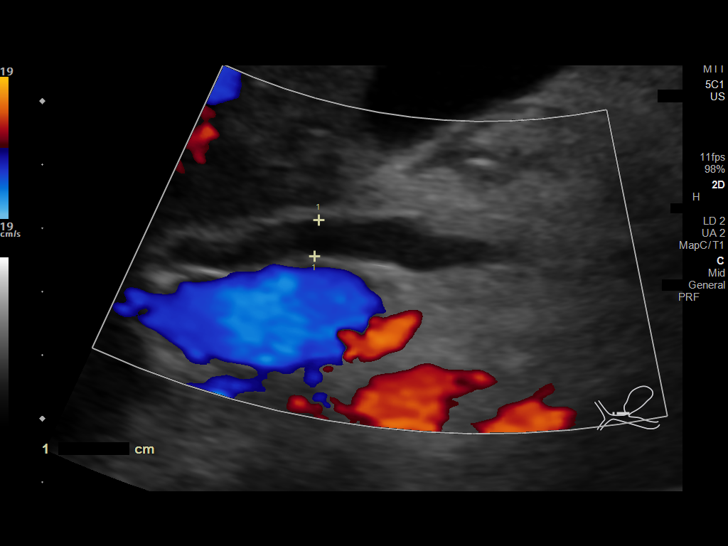
[im 31/122]
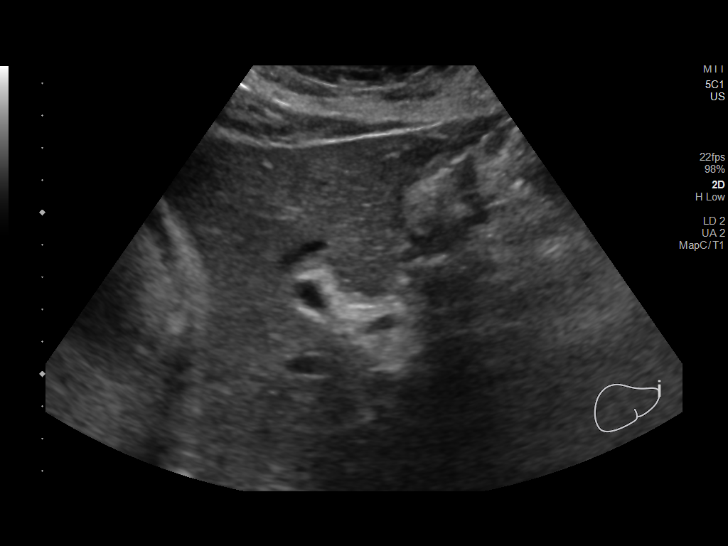
[im 41/122]
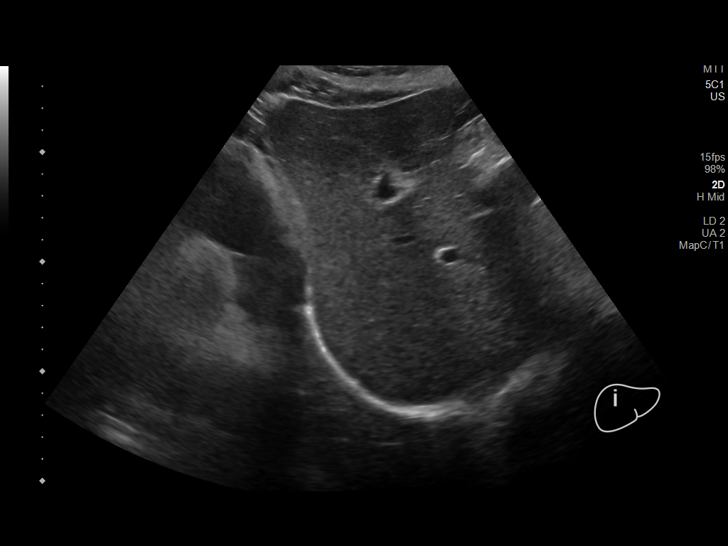
[im 46/122]
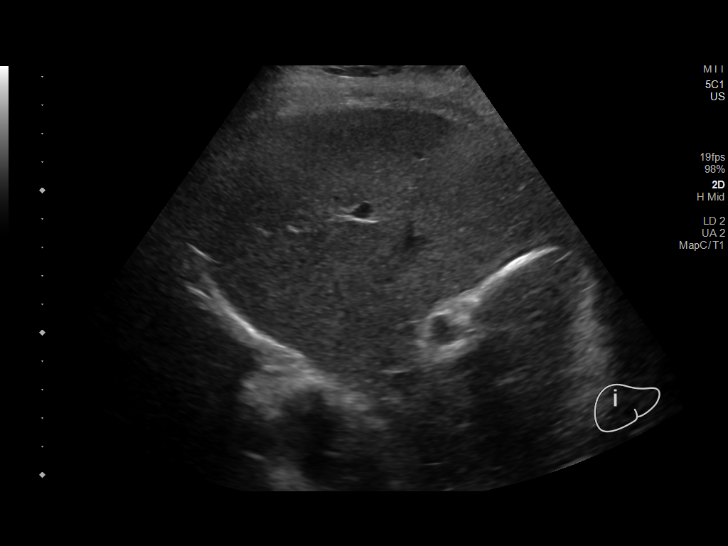
[im 56/122]
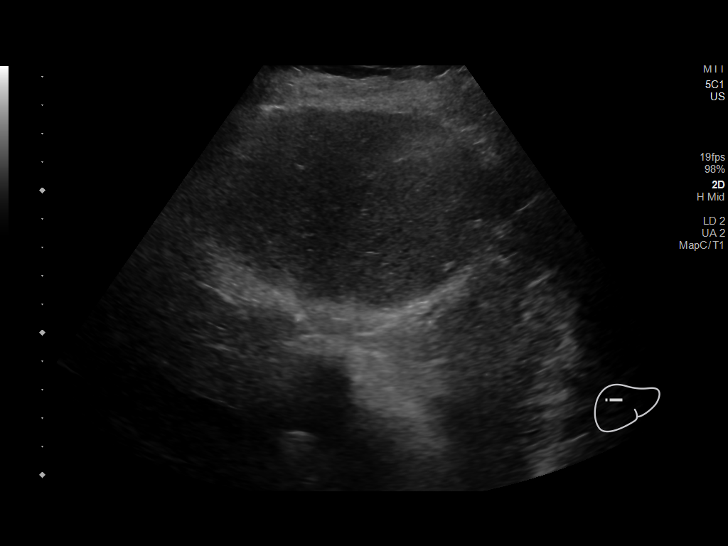
[im 66/122]
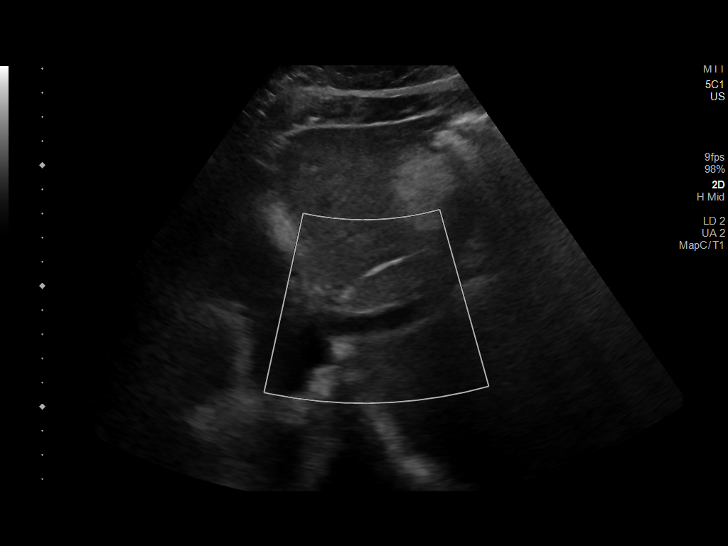
[im 76/122]
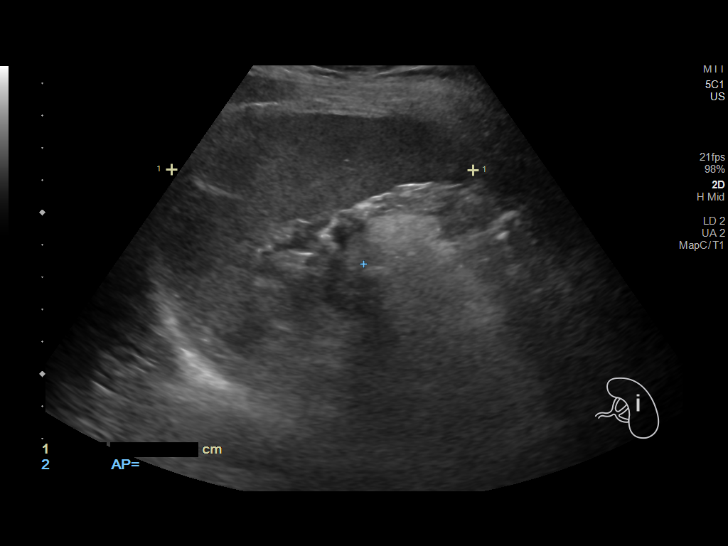
[im 81/122]
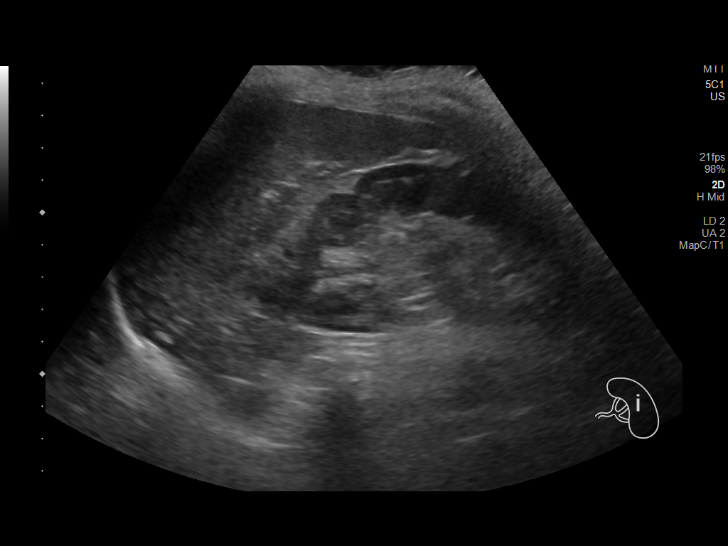
[im 91/122]
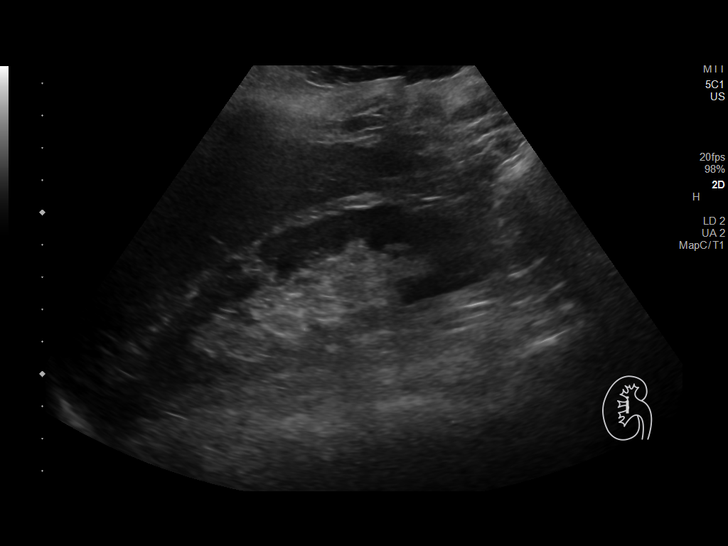
[im 101/122]
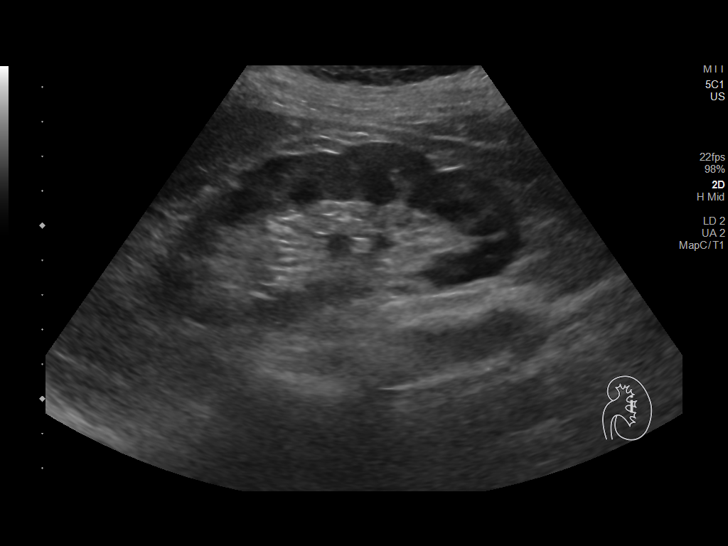
[im 111/122]
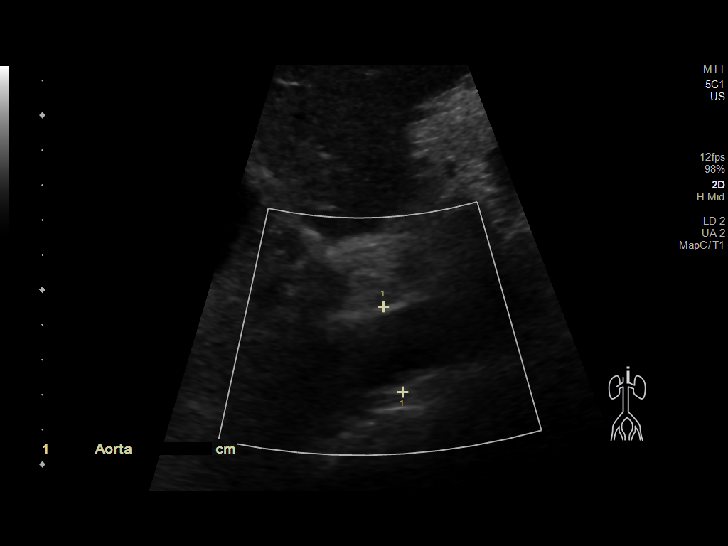
[im 122/122]
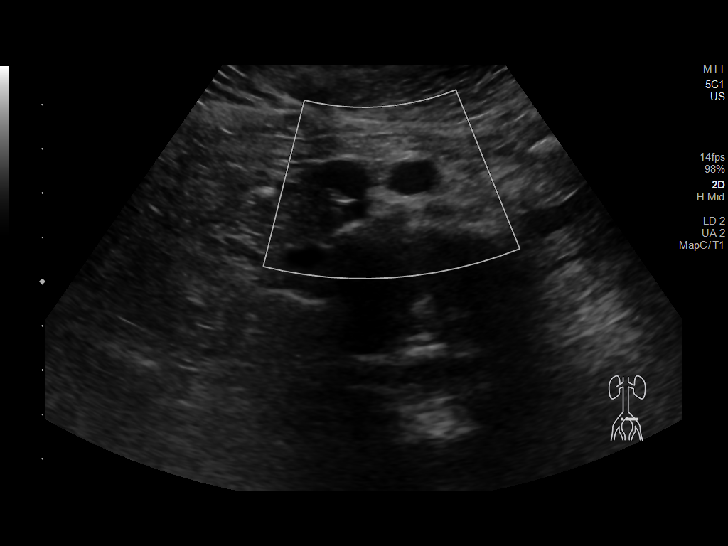

[14 of 25 positions shown; findings below may reference images not displayed]

FINDINGS: Gallbladder: No gallstones or wall thickening visualized. No
sonographic Murphy sign noted by sonographer.

Common bile duct: Diameter: 7 mm which is within normal limits.

Liver: No focal lesion identified. Within normal limits in
parenchymal echogenicity. Portal vein is patent on color Doppler
imaging with normal direction of blood flow towards the liver.

IVC: No abnormality visualized.

Pancreas: Visualized portion unremarkable.

Spleen: Size and appearance within normal limits.

Right Kidney: Length: 11.3 cm. Echogenicity within normal limits. No
mass or hydronephrosis visualized.

Left Kidney: Length: 11.2 cm. Echogenicity within normal limits. No
mass or hydronephrosis visualized.

Abdominal aorta: No aneurysm visualized.

Other findings: None.
IMPRESSION: No significant abnormality seen in the abdomen.

## 2021-03-21 ENCOUNTER — Other Ambulatory Visit (HOSPITAL_BASED_OUTPATIENT_CLINIC_OR_DEPARTMENT_OTHER): Payer: Self-pay

## 2021-03-21 MED FILL — Timolol Maleate Ophth Soln 0.25%: OPHTHALMIC | 90 days supply | Qty: 10 | Fill #1 | Status: AC

## 2021-03-28 ENCOUNTER — Telehealth: Payer: Self-pay | Admitting: Family

## 2021-03-28 NOTE — Telephone Encounter (Signed)
Left message for patient to call back and schedule Medicare Annual Wellness Visit (AWV) in office.  ° °If not able to come in office, please offer to do virtually or by telephone.  Left office number and my jabber #[phone]. ° °Due for AWVI ° °Please schedule at anytime with Nurse Health Advisor. °  °

## 2021-04-09 DIAGNOSIS — H40053 Ocular hypertension, bilateral: Secondary | ICD-10-CM | POA: Diagnosis not present

## 2021-05-01 ENCOUNTER — Other Ambulatory Visit (HOSPITAL_BASED_OUTPATIENT_CLINIC_OR_DEPARTMENT_OTHER): Payer: Self-pay

## 2021-06-07 ENCOUNTER — Other Ambulatory Visit (HOSPITAL_BASED_OUTPATIENT_CLINIC_OR_DEPARTMENT_OTHER): Payer: Self-pay

## 2021-06-17 ENCOUNTER — Other Ambulatory Visit (HOSPITAL_BASED_OUTPATIENT_CLINIC_OR_DEPARTMENT_OTHER): Payer: Self-pay

## 2021-06-27 ENCOUNTER — Telehealth: Payer: Self-pay | Admitting: Family

## 2021-06-27 NOTE — Telephone Encounter (Signed)
Left message for patient to call back and schedule Medicare Annual Wellness Visit (AWV).   Please offer to do virtually or by telephone.  Left office number and my jabber #[phone].  AWVI eligible as of 03/27/2013   Please schedule at anytime with Nurse Health Advisor.  

## 2021-07-09 DIAGNOSIS — H02834 Dermatochalasis of left upper eyelid: Secondary | ICD-10-CM | POA: Diagnosis not present

## 2021-07-09 DIAGNOSIS — H40053 Ocular hypertension, bilateral: Secondary | ICD-10-CM | POA: Diagnosis not present

## 2021-07-29 ENCOUNTER — Other Ambulatory Visit (HOSPITAL_BASED_OUTPATIENT_CLINIC_OR_DEPARTMENT_OTHER): Payer: Self-pay

## 2021-07-31 ENCOUNTER — Other Ambulatory Visit (HOSPITAL_BASED_OUTPATIENT_CLINIC_OR_DEPARTMENT_OTHER): Payer: Self-pay

## 2021-07-31 MED ORDER — TIMOLOL MALEATE 0.25 % OP SOLN
OPHTHALMIC | 3 refills | Status: DC
  Filled 2021-07-31: qty 10, 100d supply, fill #0
  Filled 2021-11-08: qty 10, 100d supply, fill #1
  Filled 2022-03-05: qty 10, 100d supply, fill #2
  Filled 2022-06-09: qty 10, 100d supply, fill #3

## 2021-08-01 ENCOUNTER — Other Ambulatory Visit (HOSPITAL_BASED_OUTPATIENT_CLINIC_OR_DEPARTMENT_OTHER): Payer: Self-pay

## 2021-09-18 ENCOUNTER — Other Ambulatory Visit (HOSPITAL_BASED_OUTPATIENT_CLINIC_OR_DEPARTMENT_OTHER): Payer: Self-pay

## 2021-09-18 ENCOUNTER — Other Ambulatory Visit: Payer: Self-pay | Admitting: Family

## 2021-09-18 MED ORDER — HYDROCHLOROTHIAZIDE 25 MG PO TABS
25.0000 mg | ORAL_TABLET | Freq: Every day | ORAL | 1 refills | Status: DC
  Filled 2021-09-18: qty 90, 90d supply, fill #0
  Filled 2022-02-10: qty 90, 90d supply, fill #1

## 2021-09-18 MED ORDER — AMLODIPINE BESYLATE 10 MG PO TABS
10.0000 mg | ORAL_TABLET | Freq: Every day | ORAL | 1 refills | Status: DC
  Filled 2021-09-18: qty 90, 90d supply, fill #0
  Filled 2022-02-10: qty 90, 90d supply, fill #1

## 2021-09-18 MED ORDER — ATORVASTATIN CALCIUM 40 MG PO TABS
40.0000 mg | ORAL_TABLET | Freq: Every day | ORAL | 1 refills | Status: DC
  Filled 2021-09-18: qty 90, 90d supply, fill #0
  Filled 2022-02-10: qty 90, 90d supply, fill #1

## 2021-09-18 MED ORDER — OMEPRAZOLE 40 MG PO CPDR
40.0000 mg | DELAYED_RELEASE_CAPSULE | Freq: Every day | ORAL | 1 refills | Status: DC
  Filled 2021-09-18: qty 90, 90d supply, fill #0
  Filled 2022-01-23 – 2022-01-24 (×3): qty 90, 90d supply, fill #1

## 2021-09-18 MED ORDER — POTASSIUM CHLORIDE CRYS ER 20 MEQ PO TBCR
20.0000 meq | EXTENDED_RELEASE_TABLET | Freq: Every day | ORAL | 1 refills | Status: DC
  Filled 2021-09-18: qty 90, 90d supply, fill #0
  Filled 2022-02-10: qty 90, 90d supply, fill #1

## 2021-10-02 ENCOUNTER — Ambulatory Visit: Payer: Medicare Other | Admitting: Family

## 2021-10-09 IMAGING — CT CT NECK W/ CM
3 of 4 series · 14 of 33 positions shown, 17 images · IV contrast (Omnipaque)
Comparison: None.

CLINICAL DATA: 74-year-old female with palpable abnormality of the
right neck reportedly discovered last year.

EXAM:
CT NECK WITH CONTRAST
TECHNIQUE: Multidetector CT imaging of the neck was performed using the
standard protocol following the bolus administration of intravenous
contrast.
CONTRAST:  75mL OMNIPAQUE IOHEXOL 300 MG/ML  SOLN

[Series 6: sag neck · sagittal · 0.45mm/px · 5 of 76 slices shown, 6 images]
[im 26/76  bone]
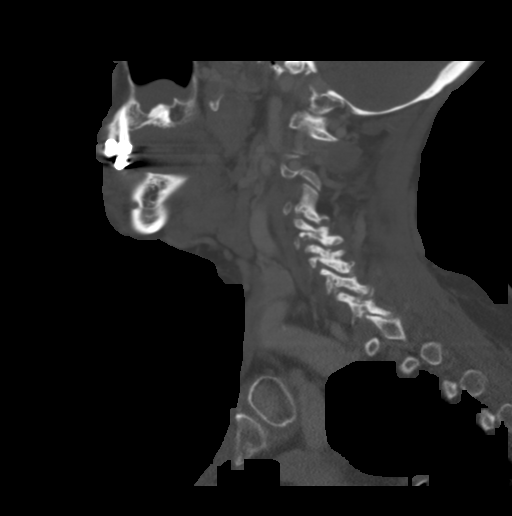
[im 32/76  bone]
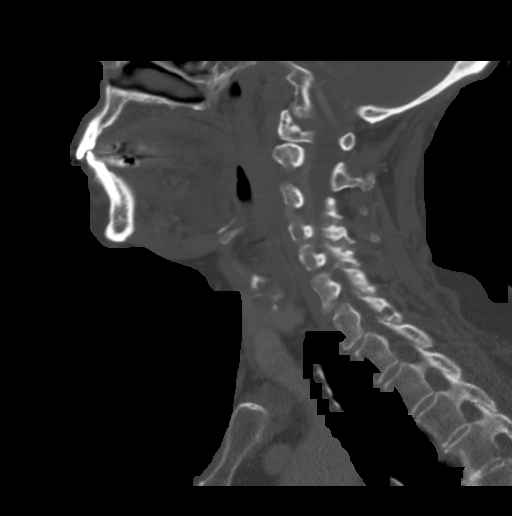
[im 38/76  soft-tissue]
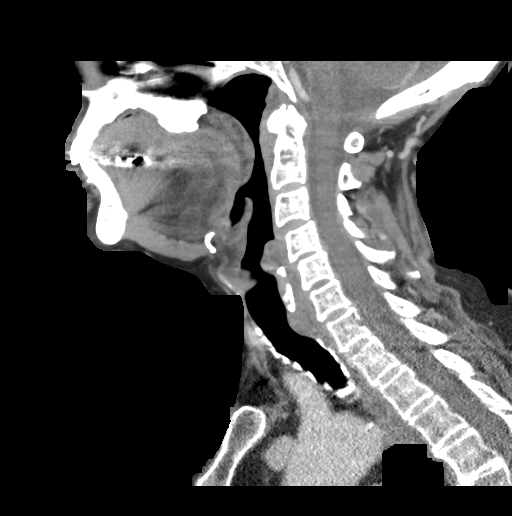
[im 38/76  bone]
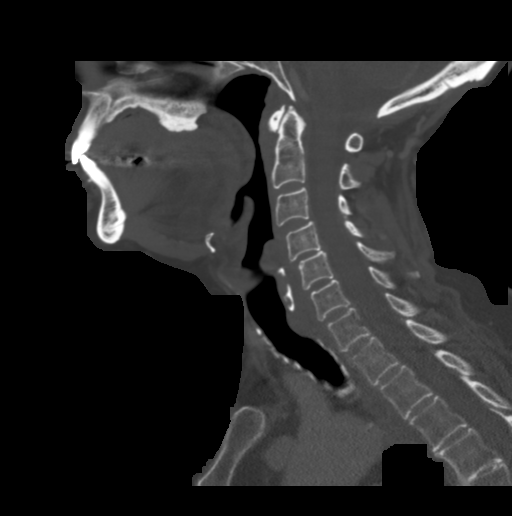
[im 44/76  bone]
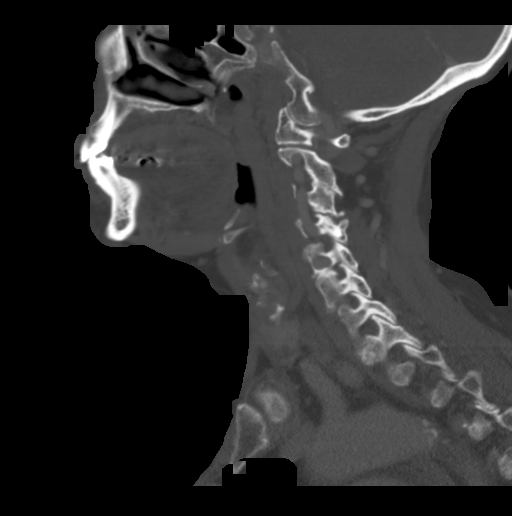
[im 51/76  bone]
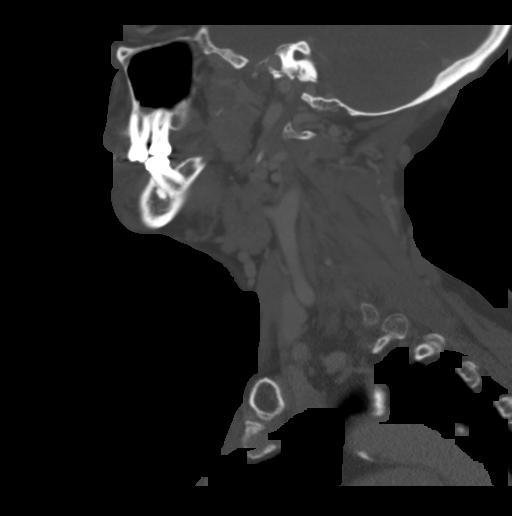

[Series 7: cor neck · coronal · 0.34mm/px · 3 of 97 slices shown]
[im 27/97  bone]
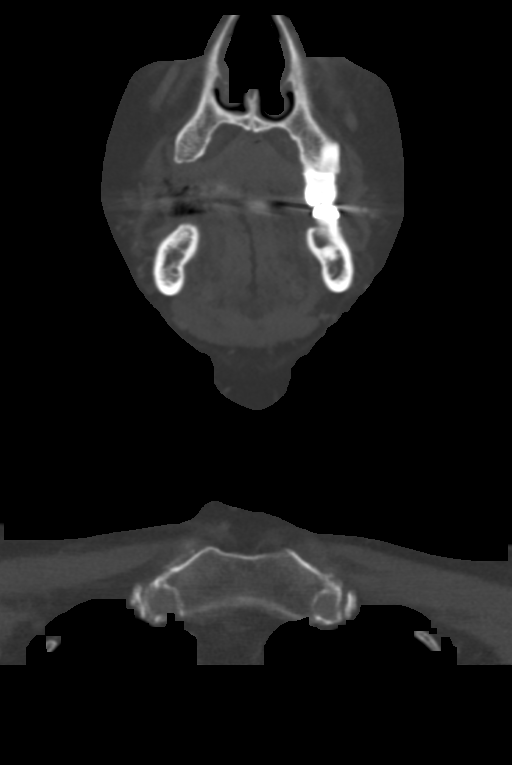
[im 41/97  bone]
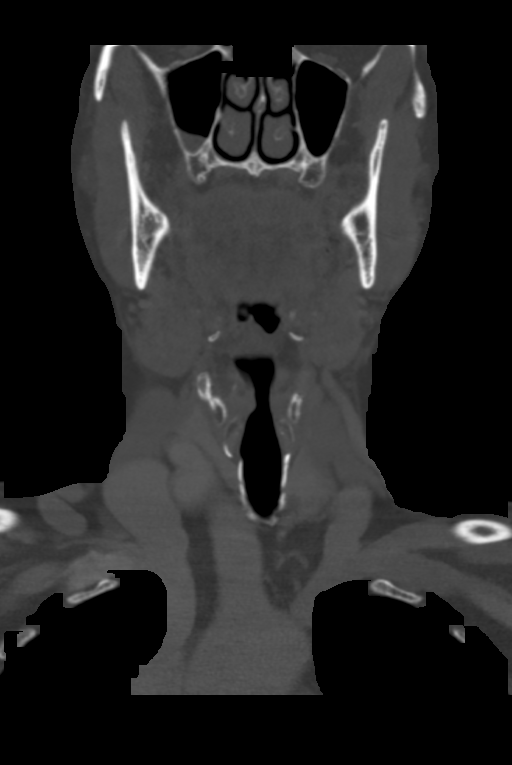
[im 56/97  bone]
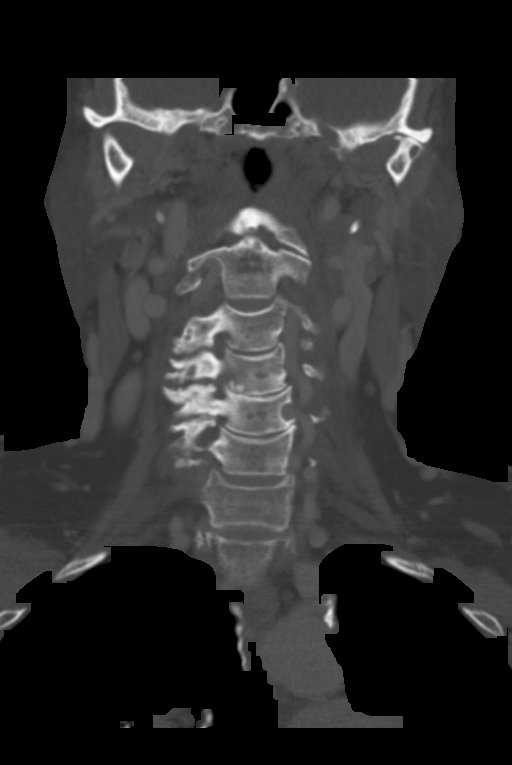

[Series 8: orthogonal ax · axial · 0.39mm/px · z∈[-300,-131]mm · 6 of 128 slices shown, 8 images]
[im 19/128  soft-tissue]
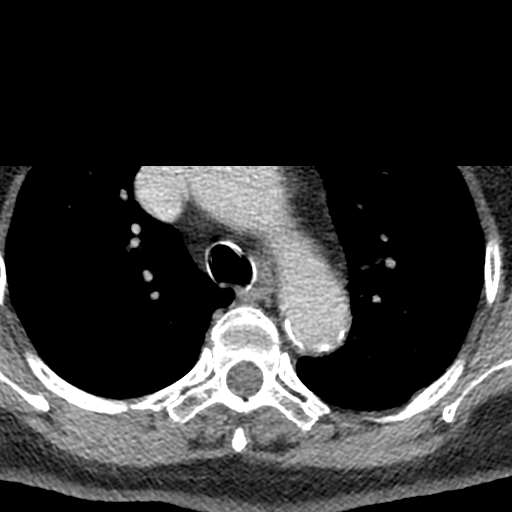
[im 19/128  bone]
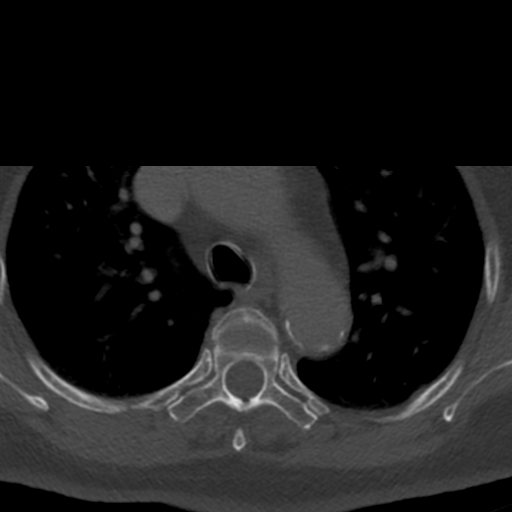
[im 37/128  bone]
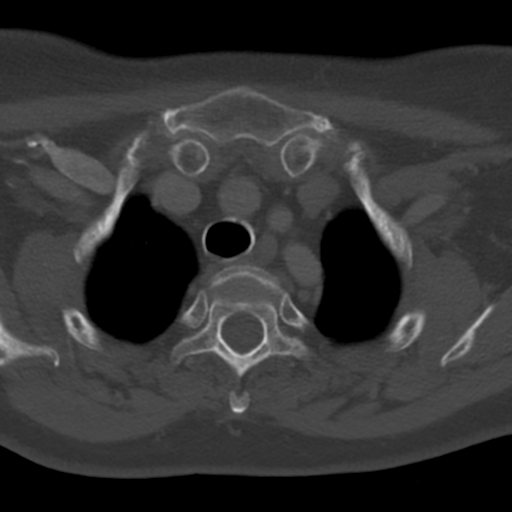
[im 55/128  bone]
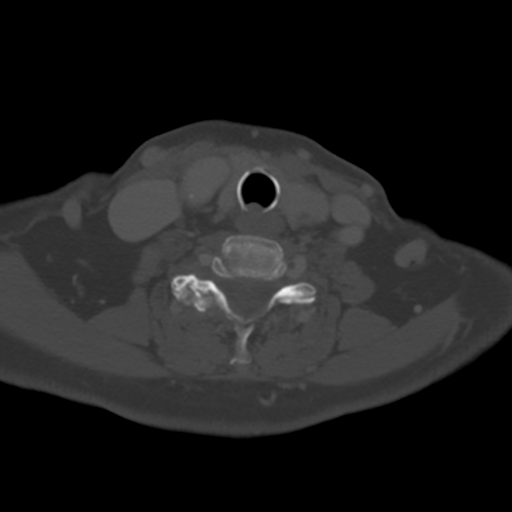
[im 73/128  bone]
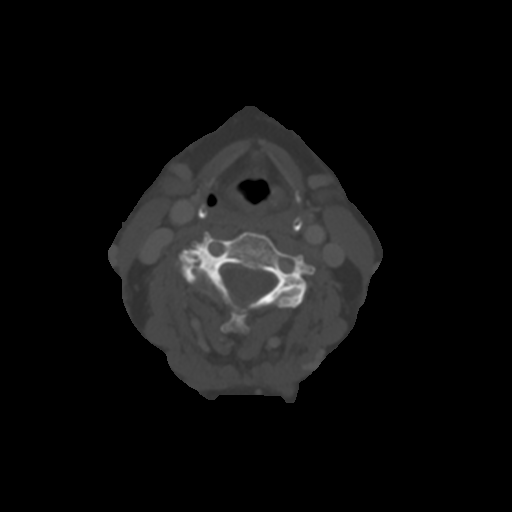
[im 91/128  soft-tissue]
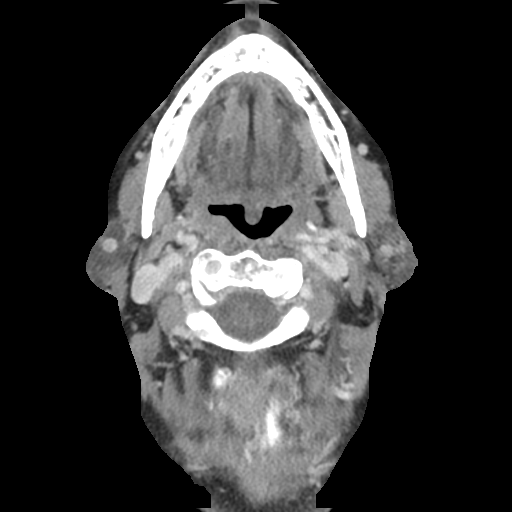
[im 91/128  bone]
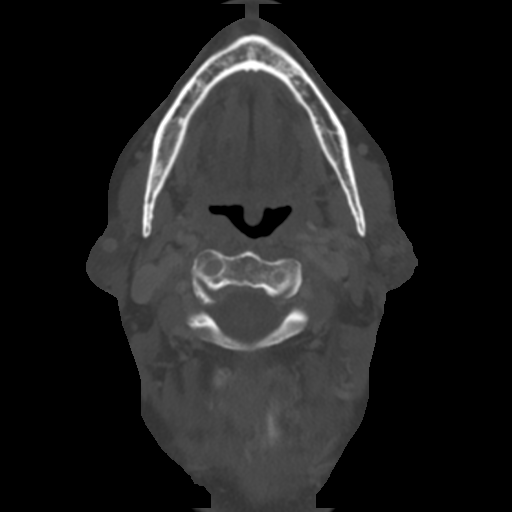
[im 109/128  bone]
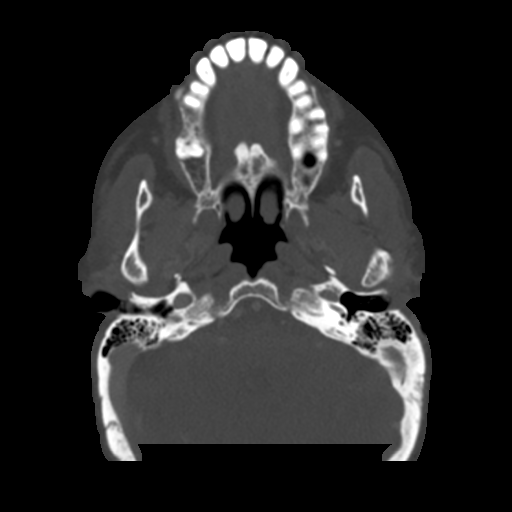

[14 of 33 positions shown; findings below may reference images not displayed]

FINDINGS: Pharynx and larynx: Larynx and pharynx soft tissue contours are
within normal limits. Negative parapharyngeal and retropharyngeal
spaces.

Salivary glands: Negative sublingual space, mild right side
boutonniere deformity of the sublingual gland (normal variant).
Submandibular and parotid glands appear symmetric and within normal
limits.

Thyroid: Negative.

Lymph nodes: Marked area of clinical concern is along the right
lateral neck seen on coronal image 45 and series 3, image 50
overlying the right sternocleidomastoid muscle just below the level
of the submandibular gland, and anterior to the right JERITO. No
regional mass or lymphadenopathy.

No cervical lymphadenopathy elsewhere. Bilateral cervical lymph node
stations are within normal limits.

Vascular: Major vascular structures in the neck and at the skull
base are patent. There is generalized arterial tortuosity in the
neck, including the right brachiocephalic artery which protrudes up
above the thoracic inlet, as well as right common carotid artery
tortuosity including near the region of clinical concern. Mild
superimposed atherosclerosis. Although there is a greater degree of
calcified plaque of both ICA siphons.

Limited intracranial: Negative.

Visualized orbits: Partially visible postoperative changes to the
globes.

Mastoids and visualized paranasal sinuses: Generally well
pneumatized, right greater than left maxillary alveolar recess
mucosal thickening. Tympanic cavities and mastoids are clear.

Skeleton: Left TMJ degeneration. Mild for age cervical disc and
endplate degeneration. There is right greater than left cervical
facet arthropathy, including relatively bulky facet spurring near
the marked area of clinical concern. No acute osseous abnormality
identified.

Upper chest: Negative lung apices. No superior mediastinal
lymphadenopathy. Calcified aortic atherosclerosis.
IMPRESSION: 1. No mass or lymphadenopathy to correspond to the marked area of
clinical concern. I note tortuosity of the right carotid artery, as
well as bulky right side cervical facet spurring in that region.
2. Negative neck soft tissues.
3. Aortic Atherosclerosis ([id number]).

## 2021-11-08 ENCOUNTER — Other Ambulatory Visit (HOSPITAL_BASED_OUTPATIENT_CLINIC_OR_DEPARTMENT_OTHER): Payer: Self-pay

## 2021-11-11 ENCOUNTER — Other Ambulatory Visit (HOSPITAL_BASED_OUTPATIENT_CLINIC_OR_DEPARTMENT_OTHER): Payer: Self-pay

## 2021-12-24 ENCOUNTER — Telehealth: Payer: Self-pay | Admitting: Pharmacist

## 2021-12-25 NOTE — Telephone Encounter (Signed)
Patient has Type 2 DM but has not seen PCP in 2023.  Called patient to reschedule missed appointment from 09/2021 - no answer.  LM on VM for patient contact office to schedule appointment.

## 2022-01-24 ENCOUNTER — Other Ambulatory Visit (HOSPITAL_BASED_OUTPATIENT_CLINIC_OR_DEPARTMENT_OTHER): Payer: Self-pay

## 2022-01-24 ENCOUNTER — Other Ambulatory Visit: Payer: Self-pay

## 2022-03-06 ENCOUNTER — Other Ambulatory Visit (HOSPITAL_BASED_OUTPATIENT_CLINIC_OR_DEPARTMENT_OTHER): Payer: Self-pay

## 2022-03-07 ENCOUNTER — Other Ambulatory Visit (HOSPITAL_BASED_OUTPATIENT_CLINIC_OR_DEPARTMENT_OTHER): Payer: Self-pay

## 2022-05-20 DIAGNOSIS — H5201 Hypermetropia, right eye: Secondary | ICD-10-CM | POA: Diagnosis not present

## 2022-05-20 DIAGNOSIS — H40053 Ocular hypertension, bilateral: Secondary | ICD-10-CM | POA: Diagnosis not present

## 2022-05-20 DIAGNOSIS — H5212 Myopia, left eye: Secondary | ICD-10-CM | POA: Diagnosis not present

## 2022-05-20 DIAGNOSIS — H524 Presbyopia: Secondary | ICD-10-CM | POA: Diagnosis not present

## 2022-05-20 DIAGNOSIS — H52223 Regular astigmatism, bilateral: Secondary | ICD-10-CM | POA: Diagnosis not present

## 2022-05-20 LAB — HM DIABETES EYE EXAM

## 2022-05-23 ENCOUNTER — Encounter (HOSPITAL_BASED_OUTPATIENT_CLINIC_OR_DEPARTMENT_OTHER): Payer: Self-pay

## 2022-05-23 ENCOUNTER — Other Ambulatory Visit (HOSPITAL_BASED_OUTPATIENT_CLINIC_OR_DEPARTMENT_OTHER): Payer: Self-pay

## 2022-06-09 ENCOUNTER — Ambulatory Visit (INDEPENDENT_AMBULATORY_CARE_PROVIDER_SITE_OTHER): Payer: Medicare Other | Admitting: Family

## 2022-06-09 ENCOUNTER — Telehealth: Payer: Self-pay | Admitting: Family

## 2022-06-09 ENCOUNTER — Other Ambulatory Visit (HOSPITAL_BASED_OUTPATIENT_CLINIC_OR_DEPARTMENT_OTHER): Payer: Self-pay

## 2022-06-09 VITALS — BP 129/72 | HR 73 | Temp 98.1°F | Resp 16 | Wt 163.0 lb

## 2022-06-09 DIAGNOSIS — E119 Type 2 diabetes mellitus without complications: Secondary | ICD-10-CM | POA: Diagnosis not present

## 2022-06-09 DIAGNOSIS — K219 Gastro-esophageal reflux disease without esophagitis: Secondary | ICD-10-CM | POA: Diagnosis not present

## 2022-06-09 DIAGNOSIS — I10 Essential (primary) hypertension: Secondary | ICD-10-CM

## 2022-06-09 DIAGNOSIS — E785 Hyperlipidemia, unspecified: Secondary | ICD-10-CM

## 2022-06-09 DIAGNOSIS — E559 Vitamin D deficiency, unspecified: Secondary | ICD-10-CM | POA: Diagnosis not present

## 2022-06-09 DIAGNOSIS — Z1231 Encounter for screening mammogram for malignant neoplasm of breast: Secondary | ICD-10-CM

## 2022-06-09 LAB — COMPREHENSIVE METABOLIC PANEL
ALT: 9 U/L (ref 0–35)
AST: 14 U/L (ref 0–37)
Albumin: 4 g/dL (ref 3.5–5.2)
Alkaline Phosphatase: 129 U/L — ABNORMAL HIGH (ref 39–117)
BUN: 22 mg/dL (ref 6–23)
CO2: 30 mEq/L (ref 19–32)
Calcium: 9.6 mg/dL (ref 8.4–10.5)
Chloride: 99 mEq/L (ref 96–112)
Creatinine, Ser: 0.78 mg/dL (ref 0.40–1.20)
GFR: 73.83 mL/min (ref >60.00)
Glucose, Bld: 100 mg/dL — ABNORMAL HIGH (ref 70–99)
Potassium: 4 mEq/L (ref 3.5–5.1)
Sodium: 139 mEq/L (ref 135–145)
Total Bilirubin: 0.5 mg/dL (ref 0.2–1.2)
Total Protein: 7.2 g/dL (ref 6.0–8.3)

## 2022-06-09 LAB — LIPID PANEL
Cholesterol: 141 mg/dL (ref 0–200)
HDL: 43.3 mg/dL (ref >39.00)
LDL Cholesterol: 75 mg/dL (ref 0–99)
NonHDL: 97.94
Total CHOL/HDL Ratio: 3
Triglycerides: 117 mg/dL (ref 0.0–149.0)
VLDL: 23.4 mg/dL (ref 0.0–40.0)

## 2022-06-09 LAB — MICROALBUMIN / CREATININE URINE RATIO
Creatinine,U: 126.3 mg/dL
Microalb Creat Ratio: 0.8 mg/g (ref 0.0–30.0)
Microalb, Ur: 1 mg/dL (ref 0.0–1.9)

## 2022-06-09 LAB — VITAMIN D 25 HYDROXY (VIT D DEFICIENCY, FRACTURES): VITD: 9.62 ng/mL — ABNORMAL LOW (ref 30.00–100.00)

## 2022-06-09 LAB — HEMOGLOBIN A1C: Hgb A1c MFr Bld: 6.7 % — ABNORMAL HIGH (ref 4.6–6.5)

## 2022-06-09 MED ORDER — POTASSIUM CHLORIDE CRYS ER 20 MEQ PO TBCR
20.0000 meq | EXTENDED_RELEASE_TABLET | Freq: Every day | ORAL | 1 refills | Status: DC
  Filled 2022-06-09: qty 90, 90d supply, fill #0
  Filled 2022-10-15: qty 90, 90d supply, fill #1

## 2022-06-09 MED ORDER — OMEPRAZOLE 40 MG PO CPDR
40.0000 mg | DELAYED_RELEASE_CAPSULE | Freq: Every day | ORAL | 1 refills | Status: DC
Start: 2022-06-09 — End: 2023-02-25
  Filled 2022-06-09: qty 90, 90d supply, fill #0
  Filled 2022-10-15: qty 90, 90d supply, fill #1

## 2022-06-09 MED ORDER — ATORVASTATIN CALCIUM 40 MG PO TABS
40.0000 mg | ORAL_TABLET | Freq: Every day | ORAL | 1 refills | Status: DC
Start: 2022-06-09 — End: 2023-02-25
  Filled 2022-06-09: qty 90, 90d supply, fill #0
  Filled 2022-10-15: qty 90, 90d supply, fill #1

## 2022-06-09 MED ORDER — HYDROCHLOROTHIAZIDE 25 MG PO TABS
25.0000 mg | ORAL_TABLET | Freq: Every day | ORAL | 1 refills | Status: DC
Start: 2022-06-09 — End: 2023-02-25
  Filled 2022-06-09: qty 90, 90d supply, fill #0
  Filled 2022-10-15: qty 90, 90d supply, fill #1

## 2022-06-09 MED ORDER — AMLODIPINE BESYLATE 10 MG PO TABS
10.0000 mg | ORAL_TABLET | Freq: Every day | ORAL | 1 refills | Status: DC
Start: 2022-06-09 — End: 2023-02-25
  Filled 2022-06-09: qty 90, 90d supply, fill #0
  Filled 2022-10-15: qty 90, 90d supply, fill #1

## 2022-06-09 NOTE — Assessment & Plan Note (Signed)
BP Readings from Last 3 Encounters:  06/09/22 129/72  09/28/20 132/60  05/22/20 137/82  At goal on amlodipine and HCTZ.

## 2022-06-09 NOTE — Telephone Encounter (Signed)
Electronic request made 

## 2022-06-09 NOTE — Progress Notes (Signed)
Subjective:     Patient ID: Jade Gross, female    DOB: 1945/12/31, 77 y.o.   MRN: [id number]  Chief Complaint  Patient presents with   Hypertension    Here for follow up, needs refills    Hyperlipidemia    Here for follow up    Hypertension  Hyperlipidemia   Patient is in today for follow up of multiple medical problems.  Her last visit was 9/22.    DM2- diet controlled.  Hyperlipidemia- maintained on statin  HTN- on amlodipine, hctz   Health Maintenance Due  Topic Date Due   Medicare Annual Wellness (AWV)  Never done   OPHTHALMOLOGY EXAM  07/28/2020   DTaP/Tdap/Td (3 - Td or Tdap) 03/04/2021   HEMOGLOBIN A1C  03/28/2021   Diabetic kidney evaluation - Urine ACR  05/22/2021   COVID-19 Vaccine (4 - 2023-24 season) 09/27/2021   Diabetic kidney evaluation - eGFR measurement  09/28/2021    Past Medical History:  Diagnosis Date   Borderline glaucoma    Eye problem    Hyperlipidemia    Hypertension     Past Surgical History:  Procedure Laterality Date   BUNIONECTOMY  ?2000   bunion removed from both feet   cataract surger/lens implant  2000   right eye   Cataract surgery, lens implant and retinal repain  2005   left eye    Family History  Problem Relation Age of Onset   Heart disease Mother        Pacemaker at age 48   Hypertension Mother    Heart disease Father        died at 17 from MI, he had first heart attack at 24   Stroke Maternal Aunt    Heart attack Maternal Grandmother    Cancer Brother        lymphoma   Heart disease Other        died at 13 from heart attack (Brother's son)   Brain cancer Daughter     Social History   Socioeconomic History   Marital status: Divorced    Spouse name: Not on file   Number of children: 1   Years of education: Not on file   Highest education level: 12th grade  Occupational History    Employer: [employer]  Tobacco Use   Smoking status: Never   Smokeless tobacco: Never  Substance and Sexual  Activity   Alcohol use: No   Drug use: No   Sexual activity: Not on file  Other Topics Concern   Not on file  Social History Narrative   Caffeine use:  <1 daily   Regular exercise:  No   Never smoked   Work at [employer] as a Merchandiser, retail in Audiological scientist   Divorced   She lives with roommate   Completed 12th grade         Social Determinants of Health   Financial Resource Strain: Low Risk  (06/08/2022)   Overall Financial Resource Strain (CARDIA)    Difficulty of Paying Living Expenses: Not hard at all  Food Insecurity: No Food Insecurity (06/08/2022)   Hunger Vital Sign    Worried About Running Out of Food in the Last Year: Never true    Ran Out of Food in the Last Year: Never true  Transportation Needs: No Transportation Needs (06/08/2022)   PRAPARE - Administrator, Civil Service (Medical): No    Lack of Transportation (Non-Medical): No  Physical Activity: Insufficiently Active (06/08/2022)  Exercise Vital Sign    Days of Exercise per Week: 4 days    Minutes of Exercise per Session: 20 min  Stress: No Stress Concern Present (06/08/2022)   [employer] of Occupational Health - Occupational Stress Questionnaire    Feeling of Stress : Not at all  Social Connections: Moderately Integrated (06/08/2022)   Social Connection and Isolation Panel [NHANES]    Frequency of Communication with Friends and Family: More than three times a week    Frequency of Social Gatherings with Friends and Family: Twice a week    Attends Religious Services: More than 4 times per year    Active Member of [employer] or Organizations: Yes    Attends Engineer, structural: More than 4 times per year    Marital Status: Divorced  Catering manager Violence: Not on file    Outpatient Medications Prior to Visit  Medication Sig Dispense Refill   aspirin 81 MG tablet Take 81 mg by mouth daily.     betamethasone dipropionate 0.05 % cream Apply on to the skin 2 (two) times daily. 30 g 0   Calcium  Carbonate-Vitamin D 600-400 MG-UNIT tablet Take 1 tablet by mouth 2 (two) times daily.     cholecalciferol (VITAMIN D) 1000 UNITS tablet Take 3,000 Units by mouth daily.     meloxicam (MOBIC) 7.5 MG tablet TAKE 1 TABLET BY MOUTH ONCE DAILY 90 tablet 1   timolol (TIMOPTIC) 0.25 % ophthalmic solution Place 1 drop into both eyes every morning 10 mL 3   vitamin C (ASCORBIC ACID) 500 MG tablet Take 500 mg by mouth every morning.     amLODipine (NORVASC) 10 MG tablet Take 1 tablet (10 mg total) by mouth daily. 90 tablet 1   atorvastatin (LIPITOR) 40 MG tablet Take 1 tablet (40 mg total) by mouth daily. 90 tablet 1   hydrochlorothiazide (HYDRODIURIL) 25 MG tablet Take 1 tablet (25 mg total) by mouth daily. **OVERDUE FOR OFFICE VISIT** 90 tablet 1   omeprazole (PRILOSEC) 40 MG capsule Take 1 capsule (40 mg total) by mouth daily. 90 capsule 1   potassium chloride SA (KLOR-CON M) 20 MEQ tablet Take 1 tablet (20 mEq total) by mouth daily. 90 tablet 1   No facility-administered medications prior to visit.    Allergies  Allergen Reactions   Lisinopril     Hyperkalemia     ROS    See HPI Objective:    Physical Exam Constitutional:      General: She is not in acute distress.    Appearance: Normal appearance. She is well-developed.  HENT:     Head: Normocephalic and atraumatic.     Right Ear: External ear normal.     Left Ear: External ear normal.  Eyes:     General: No scleral icterus. Neck:     Thyroid: No thyromegaly.  Cardiovascular:     Rate and Rhythm: Normal rate and regular rhythm.     Heart sounds: Normal heart sounds. No murmur heard. Pulmonary:     Effort: Pulmonary effort is normal. No respiratory distress.     Breath sounds: Normal breath sounds. No wheezing.  Musculoskeletal:     Cervical back: Neck supple.  Skin:    General: Skin is warm and dry.  Neurological:     Mental Status: She is alert and oriented to person, place, and time.  Psychiatric:        Mood and  Affect: Mood normal.  Behavior: Behavior normal.        Thought Content: Thought content normal.        Judgment: Judgment normal.     BP 129/72 (BP Location: Right Arm, Patient Position: Sitting, Cuff Size: Small)   Pulse 73   Temp 98.1 F (36.7 C) (Oral)   Resp 16   Wt 163 lb (73.9 kg)   LMP 02/05/1996   SpO2 95%   BMI 30.80 kg/m  Wt Readings from Last 3 Encounters:  06/09/22 163 lb (73.9 kg)  09/28/20 161 lb 3.2 oz (73.1 kg)  05/22/20 153 lb (69.4 kg)       Assessment & Plan:   Problem List Items Addressed This Visit       Unprioritized   Vitamin D deficiency   Relevant Orders   Vitamin D (25 hydroxy)   HYPERTENSION, BENIGN SYSTEMIC    BP Readings from Last 3 Encounters:  06/09/22 129/72  09/28/20 132/60  05/22/20 137/82  At goal on amlodipine and HCTZ.       Relevant Medications   amLODipine (NORVASC) 10 MG tablet   atorvastatin (LIPITOR) 40 MG tablet   hydrochlorothiazide (HYDRODIURIL) 25 MG tablet   Hyperlipidemia   Relevant Medications   amLODipine (NORVASC) 10 MG tablet   atorvastatin (LIPITOR) 40 MG tablet   hydrochlorothiazide (HYDRODIURIL) 25 MG tablet   Other Relevant Orders   Lipid panel   GASTROESOPHAGEAL REFLUX, NO ESOPHAGITIS   Relevant Medications   omeprazole (PRILOSEC) 40 MG capsule   Diabetes type 2, controlled (HCC) - Primary    Lab Results  Component Value Date   HGBA1C 6.5 09/28/2020   HGBA1C 6.1 05/22/2020   HGBA1C 6.3 04/20/2019   Lab Results  Component Value Date   MICROALBUR <0.7 05/22/2020   LDLCALC 89 09/28/2020   CREATININE 0.87 09/28/2020   Wt Readings from Last 3 Encounters:  06/09/22 163 lb (73.9 kg)  09/28/20 161 lb 3.2 oz (73.1 kg)  05/22/20 153 lb (69.4 kg)  Not on meds, obtain follow up A1C.       Relevant Medications   atorvastatin (LIPITOR) 40 MG tablet   Other Relevant Orders   HgB A1c   Comp Met (CMET)   Urine Microalbumin w/creat. ratio   Other Visit Diagnoses     Breast cancer  screening by mammogram       Relevant Orders   MM 3D SCREENING MAMMOGRAM BILATERAL BREAST       I am having Babs Sciara. Ormond maintain her cholecalciferol, Calcium Carbonate-Vitamin D, aspirin, ascorbic acid, meloxicam, betamethasone dipropionate, timolol, amLODipine, atorvastatin, hydrochlorothiazide, omeprazole, and potassium chloride SA.  Meds ordered this encounter  Medications   amLODipine (NORVASC) 10 MG tablet    Sig: Take 1 tablet (10 mg total) by mouth daily.    Dispense:  90 tablet    Refill:  1   atorvastatin (LIPITOR) 40 MG tablet    Sig: Take 1 tablet (40 mg total) by mouth daily.    Dispense:  90 tablet    Refill:  1   hydrochlorothiazide (HYDRODIURIL) 25 MG tablet    Sig: Take 1 tablet (25 mg total) by mouth daily. **OVERDUE FOR OFFICE VISIT**    Dispense:  90 tablet    Refill:  1   omeprazole (PRILOSEC) 40 MG capsule    Sig: Take 1 capsule (40 mg total) by mouth daily.    Dispense:  90 capsule    Refill:  1   potassium chloride SA (KLOR-CON M) 20 MEQ tablet  Sig: Take 1 tablet (20 mEq total) by mouth daily.    Dispense:  90 tablet    Refill:  1

## 2022-06-09 NOTE — Telephone Encounter (Signed)
Please call Dr. Sharlot Gowda and request DM record.

## 2022-06-09 NOTE — Assessment & Plan Note (Addendum)
Lab Results  Component Value Date   HGBA1C 6.5 09/28/2020   HGBA1C 6.1 05/22/2020   HGBA1C 6.3 04/20/2019   Lab Results  Component Value Date   MICROALBUR <0.7 05/22/2020   LDLCALC 89 09/28/2020   CREATININE 0.87 09/28/2020   Wt Readings from Last 3 Encounters:  06/09/22 163 lb (73.9 kg)  09/28/20 161 lb 3.2 oz (73.1 kg)  05/22/20 153 lb (69.4 kg)  Not on meds, obtain follow up A1C.

## 2022-06-10 ENCOUNTER — Telehealth: Payer: Self-pay | Admitting: Family

## 2022-06-10 ENCOUNTER — Other Ambulatory Visit (HOSPITAL_BASED_OUTPATIENT_CLINIC_OR_DEPARTMENT_OTHER): Payer: Self-pay

## 2022-06-10 DIAGNOSIS — E559 Vitamin D deficiency, unspecified: Secondary | ICD-10-CM

## 2022-06-10 MED ORDER — VITAMIN D (ERGOCALCIFEROL) 1.25 MG (50000 UNIT) PO CAPS
50000.0000 [IU] | ORAL_CAPSULE | ORAL | 0 refills | Status: DC
Start: 2022-06-10 — End: 2023-02-25
  Filled 2022-06-10: qty 12, 84d supply, fill #0

## 2022-06-10 NOTE — Telephone Encounter (Signed)
Vitamin D level is low.  Advise patient to begin vit D 50000 units once weekly for 12 weeks, then repeat vit D level (dx Vit D deficiency).    A1C is at goal continue work on diabetic diet and exercise.

## 2022-06-10 NOTE — Telephone Encounter (Signed)
Called patient but no answer, left voice mail for patient to call back.   

## 2022-06-11 NOTE — Telephone Encounter (Signed)
Pt called to go over results. All CMAs were unavailable at the time and advised a message would be sent back to give her a call back.

## 2022-06-11 NOTE — Telephone Encounter (Signed)
Called patient but no answer, left voice mail for patient to call back.   

## 2022-06-16 NOTE — Telephone Encounter (Signed)
Patient advised of results and new rx for vitamin D

## 2022-07-04 ENCOUNTER — Telehealth (HOSPITAL_BASED_OUTPATIENT_CLINIC_OR_DEPARTMENT_OTHER): Payer: Self-pay

## 2022-10-10 ENCOUNTER — Other Ambulatory Visit (HOSPITAL_BASED_OUTPATIENT_CLINIC_OR_DEPARTMENT_OTHER): Payer: Self-pay

## 2022-10-13 ENCOUNTER — Other Ambulatory Visit (HOSPITAL_BASED_OUTPATIENT_CLINIC_OR_DEPARTMENT_OTHER): Payer: Self-pay

## 2022-10-13 DIAGNOSIS — H40053 Ocular hypertension, bilateral: Secondary | ICD-10-CM | POA: Diagnosis not present

## 2022-10-13 DIAGNOSIS — H35372 Puckering of macula, left eye: Secondary | ICD-10-CM | POA: Diagnosis not present

## 2022-10-13 MED ORDER — TIMOLOL MALEATE 0.25 % OP SOLN
1.0000 [drp] | Freq: Every morning | OPHTHALMIC | 3 refills | Status: DC
  Filled 2022-10-13: qty 10, 100d supply, fill #0
  Filled 2023-01-16: qty 10, 100d supply, fill #1
  Filled 2023-04-30: qty 10, 100d supply, fill #2
  Filled 2023-07-28: qty 10, 100d supply, fill #3

## 2022-10-14 ENCOUNTER — Other Ambulatory Visit (HOSPITAL_BASED_OUTPATIENT_CLINIC_OR_DEPARTMENT_OTHER): Payer: Self-pay

## 2022-10-16 DIAGNOSIS — H43393 Other vitreous opacities, bilateral: Secondary | ICD-10-CM | POA: Diagnosis not present

## 2022-10-16 DIAGNOSIS — H43813 Vitreous degeneration, bilateral: Secondary | ICD-10-CM | POA: Diagnosis not present

## 2022-10-16 DIAGNOSIS — H02422 Myogenic ptosis of left eyelid: Secondary | ICD-10-CM | POA: Diagnosis not present

## 2022-10-16 DIAGNOSIS — H353131 Nonexudative age-related macular degeneration, bilateral, early dry stage: Secondary | ICD-10-CM | POA: Diagnosis not present

## 2022-10-16 DIAGNOSIS — H40053 Ocular hypertension, bilateral: Secondary | ICD-10-CM | POA: Diagnosis not present

## 2022-10-16 DIAGNOSIS — H35033 Hypertensive retinopathy, bilateral: Secondary | ICD-10-CM | POA: Diagnosis not present

## 2022-10-16 DIAGNOSIS — H35372 Puckering of macula, left eye: Secondary | ICD-10-CM | POA: Diagnosis not present

## 2022-10-20 DIAGNOSIS — H02402 Unspecified ptosis of left eyelid: Secondary | ICD-10-CM | POA: Diagnosis not present

## 2022-12-10 ENCOUNTER — Ambulatory Visit: Payer: Medicare Other | Admitting: Family

## 2023-01-13 ENCOUNTER — Encounter: Payer: Self-pay | Admitting: Pharmacist

## 2023-01-13 NOTE — Progress Notes (Signed)
Pharmacy Quality Measure Review  This patient is appearing on a report for being at risk of failing the adherence measure for cholesterol (statin) medications this calendar year.   Medication: atorvastatin  Last fill date: 90 for 10/15/2022 day supply  Patient missed 6 month follow up appointment with Melissa on 12/10/2022.   Called patient to see if she needed 30 DS on any of her medications until she can make a f/u appointment with Sandford Craze, NP.  Patient endorses that she has about 20 tablets left of all her medications. Offered to make follow up appointment but patient declined. States she will call back later.   Henrene Pastor, PharmD Clinical Pharmacist [facility]  Population Health [phone]

## 2023-01-16 ENCOUNTER — Other Ambulatory Visit: Payer: Self-pay

## 2023-01-16 ENCOUNTER — Other Ambulatory Visit (HOSPITAL_BASED_OUTPATIENT_CLINIC_OR_DEPARTMENT_OTHER): Payer: Self-pay

## 2023-02-25 ENCOUNTER — Ambulatory Visit: Payer: Medicare Other | Admitting: Family

## 2023-02-25 ENCOUNTER — Other Ambulatory Visit (HOSPITAL_BASED_OUTPATIENT_CLINIC_OR_DEPARTMENT_OTHER): Payer: Self-pay

## 2023-02-25 ENCOUNTER — Telehealth: Payer: Self-pay | Admitting: Family

## 2023-02-25 VITALS — BP 132/80 | HR 75 | Temp 98.5°F | Resp 16 | Ht 61.0 in | Wt 164.0 lb

## 2023-02-25 DIAGNOSIS — K219 Gastro-esophageal reflux disease without esophagitis: Secondary | ICD-10-CM

## 2023-02-25 DIAGNOSIS — R21 Rash and other nonspecific skin eruption: Secondary | ICD-10-CM | POA: Insufficient documentation

## 2023-02-25 DIAGNOSIS — E119 Type 2 diabetes mellitus without complications: Secondary | ICD-10-CM | POA: Diagnosis not present

## 2023-02-25 DIAGNOSIS — I10 Essential (primary) hypertension: Secondary | ICD-10-CM

## 2023-02-25 DIAGNOSIS — E559 Vitamin D deficiency, unspecified: Secondary | ICD-10-CM

## 2023-02-25 DIAGNOSIS — E785 Hyperlipidemia, unspecified: Secondary | ICD-10-CM | POA: Diagnosis not present

## 2023-02-25 LAB — COMPREHENSIVE METABOLIC PANEL
ALT: 10 U/L (ref 0–35)
AST: 15 U/L (ref 0–37)
Albumin: 4.3 g/dL (ref 3.5–5.2)
Alkaline Phosphatase: 137 U/L — ABNORMAL HIGH (ref 39–117)
BUN: 30 mg/dL — ABNORMAL HIGH (ref 6–23)
CO2: 28 meq/L (ref 19–32)
Calcium: 9.5 mg/dL (ref 8.4–10.5)
Chloride: 100 meq/L (ref 96–112)
Creatinine, Ser: 0.77 mg/dL (ref 0.40–1.20)
GFR: 74.61 mL/min (ref >60.00)
Glucose, Bld: 100 mg/dL — ABNORMAL HIGH (ref 70–99)
Potassium: 3.8 meq/L (ref 3.5–5.1)
Sodium: 137 meq/L (ref 135–145)
Total Bilirubin: 0.3 mg/dL (ref 0.2–1.2)
Total Protein: 7.5 g/dL (ref 6.0–8.3)

## 2023-02-25 LAB — LIPID PANEL
Cholesterol: 151 mg/dL (ref 0–200)
HDL: 47.8 mg/dL (ref >39.00)
LDL Cholesterol: 85 mg/dL (ref 0–99)
NonHDL: 103.58
Total CHOL/HDL Ratio: 3
Triglycerides: 93 mg/dL (ref 0.0–149.0)
VLDL: 18.6 mg/dL (ref 0.0–40.0)

## 2023-02-25 LAB — HEMOGLOBIN A1C: Hgb A1c MFr Bld: 6.8 % — ABNORMAL HIGH (ref 4.6–6.5)

## 2023-02-25 LAB — VITAMIN D 25 HYDROXY (VIT D DEFICIENCY, FRACTURES): VITD: 21.13 ng/mL — ABNORMAL LOW (ref 30.00–100.00)

## 2023-02-25 MED ORDER — AMLODIPINE BESYLATE 10 MG PO TABS
10.0000 mg | ORAL_TABLET | Freq: Every day | ORAL | 1 refills | Status: DC
  Filled 2023-02-25: qty 90, 90d supply, fill #0

## 2023-02-25 MED ORDER — ATORVASTATIN CALCIUM 40 MG PO TABS
40.0000 mg | ORAL_TABLET | Freq: Every day | ORAL | 1 refills | Status: DC
Start: 2023-02-25 — End: 2023-06-24
  Filled 2023-02-25: qty 90, 90d supply, fill #0

## 2023-02-25 MED ORDER — HYDROCHLOROTHIAZIDE 25 MG PO TABS
25.0000 mg | ORAL_TABLET | Freq: Every day | ORAL | 1 refills | Status: DC
  Filled 2023-02-25: qty 90, 90d supply, fill #0

## 2023-02-25 MED ORDER — OMEPRAZOLE 40 MG PO CPDR
40.0000 mg | DELAYED_RELEASE_CAPSULE | Freq: Every day | ORAL | 1 refills | Status: DC
Start: 2023-02-25 — End: 2023-06-24
  Filled 2023-02-25: qty 90, 90d supply, fill #0

## 2023-02-25 NOTE — Progress Notes (Signed)
Subjective:     Patient ID: Jade Gross, female    DOB: [birth year], 78 y.o.   MRN: [id number]  Chief Complaint  Patient presents with   Diabetes    Here for follow up   Hypertension    Here for follow up   Rash    Complains of rash on back of head/ scalp     Discussed the use of AI scribe software for clinical note transcription with the patient, who gave verbal consent to proceed.  History of Present Illness   The patient presents today for follow up.   She has a persistent rash located on her posterior scalp, which has been present for a long time and has not responded to previous treatments, including a steroid cream prescribed earlier and various dandruff shampoos. The rash has now spread to the left temple, and she is concerned about its potential visibility if she cuts her hair short.   She discusses a droopy left eyelid that worsens by the end of the day, often closing completely. This issue has been ongoing since she was 78 years old, and it affects her vision, especially when watching TV. She has seen two eye doctors recently, and thyroid tests were conducted, which came back normal. she is considering an eyelid lift procedure.  She is currently taking amlodipine and hydrochlorothiazide for blood pressure, which is well-controlled. She is also on Lipitor 40 mg for cholesterol, which was last checked in May and was at a good level. She takes omeprazole for reflux and is aware of dietary triggers. She completed a 12-week course of vitamin D supplementation last May, but the levels were not rechecked. She tries to spend time outdoors to improve her vitamin D levels.          Health Maintenance Due  Topic Date Due   Medicare Annual Wellness (AWV)  Never done   OPHTHALMOLOGY EXAM  07/28/2020   DTaP/Tdap/Td (3 - Td or Tdap) 03/04/2021   COVID-19 Vaccine (4 - 2024-25 season) 09/28/2022   HEMOGLOBIN A1C  12/10/2022    Past Medical History:  Diagnosis Date   Borderline  glaucoma    Eye problem    Hyperlipidemia    Hypertension     Past Surgical History:  Procedure Laterality Date   BUNIONECTOMY  ?2000   bunion removed from both feet   cataract surger/lens implant  2000   right eye   Cataract surgery, lens implant and retinal repain  2005   left eye    Family History  Problem Relation Age of Onset   Heart disease Mother        Pacemaker at age 40   Hypertension Mother    Heart disease Father        died at 67 from MI, he had first heart attack at 42   Stroke Maternal Aunt    Heart attack Maternal Grandmother    Cancer Brother        lymphoma   Heart disease Other        died at 110 from heart attack (Brother's son)   Brain cancer Daughter     Social History   Socioeconomic History   Marital status: Divorced    Spouse name: Not on file   Number of children: 1   Years of education: Not on file   Highest education level: 12th grade  Occupational History    Employer: [employer]  Tobacco Use   Smoking status: Never   Smokeless  tobacco: Never  Substance and Sexual Activity   Alcohol use: No   Drug use: No   Sexual activity: Not on file  Other Topics Concern   Not on file  Social History Narrative   Caffeine use:  <1 daily   Regular exercise:  No   Never smoked   Work at [employer] as a Merchandiser, retail in Audiological scientist   Divorced   She lives with roommate   Completed 12th grade         Social Drivers of Corporate investment banker Strain: Low Risk  (02/24/2023)   Overall Financial Resource Strain (CARDIA)    Difficulty of Paying Living Expenses: Not hard at all  Food Insecurity: No Food Insecurity (02/24/2023)   Hunger Vital Sign    Worried About Running Out of Food in the Last Year: Never true    Ran Out of Food in the Last Year: Never true  Transportation Needs: No Transportation Needs (02/24/2023)   PRAPARE - Administrator, Civil Service (Medical): No    Lack of Transportation (Non-Medical): No  Physical Activity:  Insufficiently Active (02/24/2023)   Exercise Vital Sign    Days of Exercise per Week: 2 days    Minutes of Exercise per Session: 20 min  Stress: No Stress Concern Present (02/24/2023)   [employer] of Occupational Health - Occupational Stress Questionnaire    Feeling of Stress : Not at all  Social Connections: Moderately Integrated (02/24/2023)   Social Connection and Isolation Panel [NHANES]    Frequency of Communication with Friends and Family: More than three times a week    Frequency of Social Gatherings with Friends and Family: Twice a week    Attends Religious Services: More than 4 times per year    Active Member of [employer] or Organizations: No    Attends Engineer, structural: More than 4 times per year    Marital Status: Divorced  Catering manager Violence: Not on file    Outpatient Medications Prior to Visit  Medication Sig Dispense Refill   aspirin 81 MG tablet Take 81 mg by mouth daily.     betamethasone dipropionate 0.05 % cream Apply on to the skin 2 (two) times daily. 30 g 0   Calcium Carbonate-Vitamin D 600-400 MG-UNIT tablet Take 1 tablet by mouth 2 (two) times daily.     cholecalciferol (VITAMIN D) 1000 UNITS tablet Take 3,000 Units by mouth daily.     potassium chloride SA (KLOR-CON M) 20 MEQ tablet Take 1 tablet (20 mEq total) by mouth daily. 90 tablet 1   timolol (TIMOPTIC) 0.25 % ophthalmic solution Place 1 drop into both eyes every morning 10 mL 3   vitamin C (ASCORBIC ACID) 500 MG tablet Take 500 mg by mouth every morning.     amLODipine (NORVASC) 10 MG tablet Take 1 tablet (10 mg total) by mouth daily. 90 tablet 1   atorvastatin (LIPITOR) 40 MG tablet Take 1 tablet (40 mg total) by mouth daily. 90 tablet 1   hydrochlorothiazide (HYDRODIURIL) 25 MG tablet Take 1 tablet (25 mg total) by mouth daily. **OVERDUE FOR OFFICE VISIT** 90 tablet 1   meloxicam (MOBIC) 7.5 MG tablet TAKE 1 TABLET BY MOUTH ONCE DAILY 90 tablet 1   omeprazole (PRILOSEC) 40 MG  capsule Take 1 capsule (40 mg total) by mouth daily. 90 capsule 1   Vitamin D, Ergocalciferol, (DRISDOL) 1.25 MG (50000 UNIT) CAPS capsule Take 1 capsule (50,000 Units total) by mouth every 7 (seven)  days. 12 capsule 0   No facility-administered medications prior to visit.    Allergies  Allergen Reactions   Lisinopril     Hyperkalemia     Review of Systems  Skin:  Positive for rash.       Objective:    Physical Exam Constitutional:      General: She is not in acute distress.    Appearance: Normal appearance. She is well-developed.  HENT:     Head: Normocephalic and atraumatic.     Comments: Left Ptosis    Right Ear: External ear normal.     Left Ear: External ear normal.  Eyes:     General: No scleral icterus. Neck:     Thyroid: No thyromegaly.  Cardiovascular:     Rate and Rhythm: Normal rate and regular rhythm.     Heart sounds: Normal heart sounds. No murmur heard. Pulmonary:     Effort: Pulmonary effort is normal. No respiratory distress.     Breath sounds: Normal breath sounds. No wheezing.  Musculoskeletal:     Cervical back: Neck supple.  Skin:    General: Skin is warm and dry.     Comments: Red flaky rash noted posterior scalp and left temporal scalp region  Neurological:     Mental Status: She is alert and oriented to person, place, and time.  Psychiatric:        Mood and Affect: Mood normal.        Behavior: Behavior normal.        Thought Content: Thought content normal.        Judgment: Judgment normal.      BP 132/80 (BP Location: Right Arm, Patient Position: Sitting, Cuff Size: Small)   Pulse 75   Temp 98.5 F (36.9 C) (Oral)   Resp 16   Ht 5\' 1"  (1.549 m)   Wt 164 lb (74.4 kg)   LMP 02/05/1996   SpO2 96%   BMI 30.99 kg/m  Wt Readings from Last 3 Encounters:  02/25/23 164 lb (74.4 kg)  06/09/22 163 lb (73.9 kg)  09/28/20 161 lb 3.2 oz (73.1 kg)       Assessment & Plan:   Problem List Items Addressed This Visit        Unprioritized   Vitamin D deficiency   Not currently on supplement.        Relevant Orders   VITAMIN D 25 Hydroxy (Vit-D Deficiency, Fractures)   Skin rash - Primary   Will refer to dermatology- possible psoriasis.       Relevant Orders   Ambulatory referral to Dermatology   HYPERTENSION, BENIGN SYSTEMIC   BP stable, continue amlodipine and hydrochlorothiazide.       Relevant Medications   atorvastatin (LIPITOR) 40 MG tablet   hydrochlorothiazide (HYDRODIURIL) 25 MG tablet   amLODipine (NORVASC) 10 MG tablet   Hyperlipidemia   Lab Results  Component Value Date   CHOL 141 06/09/2022   HDL 43.30 06/09/2022   LDLCALC 75 06/09/2022   TRIG 117.0 06/09/2022   CHOLHDL 3 06/09/2022   At goal on lipitor 40mg . Continue same.       Relevant Medications   atorvastatin (LIPITOR) 40 MG tablet   hydrochlorothiazide (HYDRODIURIL) 25 MG tablet   amLODipine (NORVASC) 10 MG tablet   Other Relevant Orders   Comp Met (CMET)   Lipid panel   GASTROESOPHAGEAL REFLUX, NO ESOPHAGITIS   Relevant Medications   omeprazole (PRILOSEC) 40 MG capsule   Diabetes type 2, controlled (HCC)  Lab Results  Component Value Date   HGBA1C 6.7 (H) 06/09/2022   HGBA1C 6.5 09/28/2020   HGBA1C 6.1 05/22/2020   Lab Results  Component Value Date   MICROALBUR 1.0 06/09/2022   LDLCALC 75 06/09/2022   CREATININE 0.78 06/09/2022   Diet  controlled. Update A1C.         Relevant Medications   atorvastatin (LIPITOR) 40 MG tablet   Other Relevant Orders   HgB A1c    I have discontinued Babs Sciara. Gaspari's meloxicam and Vitamin D (Ergocalciferol). I have also changed her hydrochlorothiazide. Additionally, I am having her maintain her cholecalciferol, Calcium Carbonate-Vitamin D, aspirin, ascorbic acid, betamethasone dipropionate, potassium chloride SA, timolol, omeprazole, atorvastatin, and amLODipine.  Meds ordered this encounter  Medications   omeprazole (PRILOSEC) 40 MG capsule    Sig: Take 1 capsule  (40 mg total) by mouth daily.    Dispense:  90 capsule    Refill:  1    Supervising Provider:   Danise Edge A [[id number]]   atorvastatin (LIPITOR) 40 MG tablet    Sig: Take 1 tablet (40 mg total) by mouth daily.    Dispense:  90 tablet    Refill:  1    Supervising Provider:   Danise Edge A [[id number]]   hydrochlorothiazide (HYDRODIURIL) 25 MG tablet    Sig: Take 1 tablet (25 mg total) by mouth daily.    Dispense:  90 tablet    Refill:  1    Supervising Provider:   Danise Edge A [[id number]]   amLODipine (NORVASC) 10 MG tablet    Sig: Take 1 tablet (10 mg total) by mouth daily.    Dispense:  90 tablet    Refill:  1    Supervising Provider:   Danise Edge A [[id number]]

## 2023-02-25 NOTE — Assessment & Plan Note (Signed)
Will refer to dermatology- possible psoriasis.

## 2023-02-25 NOTE — Telephone Encounter (Signed)
Electronic request sent

## 2023-02-25 NOTE — Assessment & Plan Note (Signed)
BP stable, continue amlodipine and hydrochlorothiazide.

## 2023-02-25 NOTE — Assessment & Plan Note (Signed)
Lab Results  Component Value Date   HGBA1C 6.7 (H) 06/09/2022   HGBA1C 6.5 09/28/2020   HGBA1C 6.1 05/22/2020   Lab Results  Component Value Date   MICROALBUR 1.0 06/09/2022   LDLCALC 75 06/09/2022   CREATININE 0.78 06/09/2022   Diet  controlled. Update A1C.

## 2023-02-25 NOTE — Telephone Encounter (Signed)
Please request DM eye exams from:  [facility]

## 2023-02-25 NOTE — Assessment & Plan Note (Signed)
Not currently on supplement.

## 2023-02-25 NOTE — Patient Instructions (Signed)
VISIT SUMMARY:  During today's visit, we discussed your persistent rash, droopy eyelid, and a spot on your foot. We also reviewed your current medications and overall health, including your blood pressure, cholesterol, and vitamin D levels.  YOUR PLAN:  -SCALP RASH: A persistent rash on your scalp that has not responded to previous treatments. We will refer you to a dermatologist for further evaluation and management, and we will also order a blood test to rule out any systemic causes.  -VITAMIN D DEFICIENCY: Your vitamin D levels were low when last checked in May. We will check your vitamin D levels today and, depending on the results, you may need to take a daily supplement of 3000 IU of vitamin D.  -HYPERLIPIDEMIA: Your cholesterol levels were within the target range during your last check in May. Continue taking Lipitor 40mg  daily to manage your cholesterol levels.  -HYPERTENSION: Your blood pressure is well-controlled with your current medications. Continue taking Amlodipine and Hydrochlorothiazide as prescribed.  -GASTROESOPHAGEAL REFLUX DISEASE (GERD): Your reflux symptoms are well-controlled with Omeprazole. Continue taking Omeprazole as prescribed.  -DROOPY EYELID: A droopy eyelid that worsens over time and affects your vision. Please follow up with Ophthalmology to discuss surgical repair.   INSTRUCTIONS:  Please follow up in 3-4 months. We will also check your vitamin D levels today. Depending on the results, you may need to start a daily vitamin D supplement. Additionally, we will refer you to a dermatologist for your persistent rash and to an ophthalmologist for your droopy eyelid.

## 2023-02-25 NOTE — Assessment & Plan Note (Signed)
Lab Results  Component Value Date   CHOL 141 06/09/2022   HDL 43.30 06/09/2022   LDLCALC 75 06/09/2022   TRIG 117.0 06/09/2022   CHOLHDL 3 06/09/2022   At goal on lipitor 40mg . Continue same.

## 2023-02-26 ENCOUNTER — Other Ambulatory Visit (HOSPITAL_BASED_OUTPATIENT_CLINIC_OR_DEPARTMENT_OTHER): Payer: Self-pay

## 2023-02-26 ENCOUNTER — Telehealth: Payer: Self-pay | Admitting: Family

## 2023-02-26 DIAGNOSIS — E559 Vitamin D deficiency, unspecified: Secondary | ICD-10-CM

## 2023-02-26 MED ORDER — VITAMIN D (ERGOCALCIFEROL) 1.25 MG (50000 UNIT) PO CAPS
50000.0000 [IU] | ORAL_CAPSULE | ORAL | 0 refills | Status: DC
  Filled 2023-02-26: qty 12, 84d supply, fill #0

## 2023-02-26 NOTE — Telephone Encounter (Signed)
Called patient but no answer, left voice mail for patient to call back.   Ok to Archivist

## 2023-02-26 NOTE — Telephone Encounter (Signed)
Vitamin D level is low.  Advise patient to begin vit D 50000 units once weekly for 12 weeks, then repeat vit D level (dx Vit D deficiency).    Cholesterol looks good, continue atorvastatin.  Diabetes is at goal but A1C slightly higher than last visit.  Please continue to work on healthy diet, exercise and weight loss.

## 2023-02-27 ENCOUNTER — Other Ambulatory Visit (HOSPITAL_BASED_OUTPATIENT_CLINIC_OR_DEPARTMENT_OTHER): Payer: Self-pay

## 2023-02-27 ENCOUNTER — Telehealth: Payer: Self-pay

## 2023-02-27 MED ORDER — MELOXICAM 7.5 MG PO TABS
7.5000 mg | ORAL_TABLET | Freq: Every day | ORAL | 0 refills | Status: DC | PRN
  Filled 2023-02-27: qty 30, 30d supply, fill #0

## 2023-02-27 NOTE — Telephone Encounter (Signed)
Copied from CRM [id number]. Topic: Clinical - Medication Question >> Feb 27, 2023  2:14 PM Deaijah H wrote: Reason for CRM: Patient stated Dr. Peggyann Juba mentioned an Anti-inflammatory at last visit and would like to know if she will be starting her on that or no. / please call [phone]

## 2023-02-27 NOTE — Addendum Note (Signed)
Addended by: Sandford Craze on: 02/27/2023 02:52 PM   Modules accepted: Orders

## 2023-02-27 NOTE — Telephone Encounter (Signed)
Rx sent for meloxicam once daily as needed. Please use sparingly.

## 2023-02-27 NOTE — Telephone Encounter (Signed)
 Called patient but no answer, left voice mail for patient to call back.

## 2023-03-02 ENCOUNTER — Telehealth: Payer: Self-pay

## 2023-03-02 NOTE — Telephone Encounter (Signed)
 Spoke to patient

## 2023-03-02 NOTE — Telephone Encounter (Signed)
Patient reports she called Friday and received this information from "someone in the office"

## 2023-03-02 NOTE — Telephone Encounter (Signed)
 Information given to patient.

## 2023-03-02 NOTE — Telephone Encounter (Signed)
 Called patient but no answer, left voice mail for patient to call back.

## 2023-03-02 NOTE — Telephone Encounter (Signed)
Copied from CRM [id number]. Topic: General - Other >> Mar 02, 2023  9:38 AM Hector Shade B wrote: Reason for CRM: Patient called in stating she was calling to speak with Ms. Windell Moulding, she stated she had been called and missed the phone call, advised I wasn't seeing anything as far a messages for today, but it did show medication had been called in she stated she was picking up the meloxicam today. But if it was anything else concerning labs to please call her at [phone]

## 2023-03-12 ENCOUNTER — Telehealth: Payer: Self-pay

## 2023-03-12 NOTE — Telephone Encounter (Signed)
Copied from CRM [id number]. Topic: General - Other >> Mar 05, 2023 11:33 AM Kathryne Eriksson wrote: Reason for CRM: Diabetic Eye Exam

## 2023-03-12 NOTE — Telephone Encounter (Signed)
Did we receive a report?  I don't see one attached to the CRM.

## 2023-03-12 NOTE — Telephone Encounter (Signed)
It looks like Windell Moulding has requested it. We do not have it back yet.

## 2023-03-13 NOTE — Telephone Encounter (Signed)
Did we request the report?  tks

## 2023-03-13 NOTE — Telephone Encounter (Signed)
Gelene Mink OD's office sent a notification to let us know they did not do a diabetic eye exam, they just did a regular exam because they were not aware patient was diabetic.  Report requested again as eye exam report.

## 2023-03-17 ENCOUNTER — Other Ambulatory Visit: Payer: Self-pay | Admitting: Family

## 2023-03-18 ENCOUNTER — Other Ambulatory Visit (HOSPITAL_BASED_OUTPATIENT_CLINIC_OR_DEPARTMENT_OTHER): Payer: Self-pay

## 2023-03-18 MED ORDER — POTASSIUM CHLORIDE CRYS ER 20 MEQ PO TBCR
20.0000 meq | EXTENDED_RELEASE_TABLET | Freq: Every day | ORAL | 1 refills | Status: DC
  Filled 2023-03-18: qty 90, 90d supply, fill #0

## 2023-04-14 ENCOUNTER — Other Ambulatory Visit (HOSPITAL_BASED_OUTPATIENT_CLINIC_OR_DEPARTMENT_OTHER): Payer: Self-pay

## 2023-04-14 ENCOUNTER — Encounter: Payer: Self-pay | Admitting: Physician Assistant

## 2023-04-14 ENCOUNTER — Ambulatory Visit (INDEPENDENT_AMBULATORY_CARE_PROVIDER_SITE_OTHER): Admitting: Physician Assistant

## 2023-04-14 VITALS — BP 171/90 | HR 72 | Ht 61.0 in | Wt 164.8 lb

## 2023-04-14 DIAGNOSIS — M7032 Other bursitis of elbow, left elbow: Secondary | ICD-10-CM | POA: Diagnosis not present

## 2023-04-14 DIAGNOSIS — M25522 Pain in left elbow: Secondary | ICD-10-CM

## 2023-04-14 MED ORDER — PREDNISONE 20 MG PO TABS
20.0000 mg | ORAL_TABLET | Freq: Every day | ORAL | 0 refills | Status: DC
  Filled 2023-04-14: qty 5, 5d supply, fill #0

## 2023-04-14 MED ORDER — SULFAMETHOXAZOLE-TRIMETHOPRIM 800-160 MG PO TABS
1.0000 | ORAL_TABLET | Freq: Two times a day (BID) | ORAL | 0 refills | Status: DC
  Filled 2023-04-14: qty 20, 10d supply, fill #0

## 2023-04-14 NOTE — Patient Instructions (Addendum)
 [facility]  [facility] [address], [place], [place] [address]

## 2023-04-14 NOTE — Progress Notes (Unsigned)
      Established patient visit   Patient: Jade Gross   DOB: 1945/07/27   78 y.o. Female  MRN: [id number] Visit Date: 04/14/2023  Today's healthcare provider: Alfredia Ferguson, PA-C   No chief complaint on file.  Subjective     A few days  Denies pain  Medications: Outpatient Medications Prior to Visit  Medication Sig   amLODipine (NORVASC) 10 MG tablet Take 1 tablet (10 mg total) by mouth daily.   aspirin 81 MG tablet Take 81 mg by mouth daily.   atorvastatin (LIPITOR) 40 MG tablet Take 1 tablet (40 mg total) by mouth daily.   betamethasone dipropionate 0.05 % cream Apply on to the skin 2 (two) times daily.   Calcium Carbonate-Vitamin D 600-400 MG-UNIT tablet Take 1 tablet by mouth 2 (two) times daily.   cholecalciferol (VITAMIN D) 1000 UNITS tablet Take 3,000 Units by mouth daily.   hydrochlorothiazide (HYDRODIURIL) 25 MG tablet Take 1 tablet (25 mg total) by mouth daily.   meloxicam (MOBIC) 7.5 MG tablet Take 1 tablet (7.5 mg total) by mouth daily as needed for pain.   omeprazole (PRILOSEC) 40 MG capsule Take 1 capsule (40 mg total) by mouth daily.   potassium chloride SA (KLOR-CON M) 20 MEQ tablet Take 1 tablet (20 mEq total) by mouth daily.   timolol (TIMOPTIC) 0.25 % ophthalmic solution Place 1 drop into both eyes every morning   vitamin C (ASCORBIC ACID) 500 MG tablet Take 500 mg by mouth every morning.   Vitamin D, Ergocalciferol, (DRISDOL) 1.25 MG (50000 UNIT) CAPS capsule Take 1 capsule (50,000 Units total) by mouth every 7 (seven) days.   No facility-administered medications prior to visit.    Review of Systems {Insert previous labs (optional):23779} {See past labs  Heme  Chem  Endocrine  Serology  Results Review (optional):1}   Objective    LMP 02/05/1996  {Insert last BP/Wt (optional):23777}{See vitals history (optional):1}  Physical Exam Vitals reviewed.  Constitutional:      Appearance: She is not ill-appearing.  HENT:     Head: Normocephalic.   Eyes:     Conjunctiva/sclera: Conjunctivae normal.  Cardiovascular:     Rate and Rhythm: Normal rate.  Pulmonary:     Effort: Pulmonary effort is normal. No respiratory distress.  Musculoskeletal:     Comments: L elbow with significant erythema, edema, fluctuance  Neurological:     Mental Status: She is alert and oriented to person, place, and time.  Psychiatric:        Mood and Affect: Mood normal.        Behavior: Behavior normal.     ***  No results found for any visits on 04/14/23.  Assessment & Plan    There are no diagnoses linked to this encounter.  ***  No follow-ups on file.       Alfredia Ferguson, PA-C  [facility] [phone] (phone) [phone] (fax)  [facility]

## 2023-04-15 ENCOUNTER — Encounter: Payer: Self-pay | Admitting: Physician Assistant

## 2023-04-17 DIAGNOSIS — M7022 Olecranon bursitis, left elbow: Secondary | ICD-10-CM | POA: Diagnosis not present

## 2023-04-23 DIAGNOSIS — M7022 Olecranon bursitis, left elbow: Secondary | ICD-10-CM | POA: Diagnosis not present

## 2023-04-30 ENCOUNTER — Other Ambulatory Visit: Payer: Self-pay

## 2023-04-30 ENCOUNTER — Other Ambulatory Visit (HOSPITAL_BASED_OUTPATIENT_CLINIC_OR_DEPARTMENT_OTHER): Payer: Self-pay

## 2023-06-16 DIAGNOSIS — H02834 Dermatochalasis of left upper eyelid: Secondary | ICD-10-CM | POA: Diagnosis not present

## 2023-06-16 DIAGNOSIS — H35372 Puckering of macula, left eye: Secondary | ICD-10-CM | POA: Diagnosis not present

## 2023-06-16 DIAGNOSIS — H40053 Ocular hypertension, bilateral: Secondary | ICD-10-CM | POA: Diagnosis not present

## 2023-06-16 DIAGNOSIS — H353132 Nonexudative age-related macular degeneration, bilateral, intermediate dry stage: Secondary | ICD-10-CM | POA: Diagnosis not present

## 2023-06-24 ENCOUNTER — Telehealth: Payer: Self-pay | Admitting: Family

## 2023-06-24 ENCOUNTER — Ambulatory Visit (INDEPENDENT_AMBULATORY_CARE_PROVIDER_SITE_OTHER): Payer: Medicare Other | Admitting: Family

## 2023-06-24 ENCOUNTER — Other Ambulatory Visit (HOSPITAL_BASED_OUTPATIENT_CLINIC_OR_DEPARTMENT_OTHER): Payer: Self-pay

## 2023-06-24 VITALS — BP 137/66 | HR 68 | Temp 98.8°F | Resp 16 | Ht 61.0 in | Wt 164.0 lb

## 2023-06-24 DIAGNOSIS — E119 Type 2 diabetes mellitus without complications: Secondary | ICD-10-CM

## 2023-06-24 DIAGNOSIS — K219 Gastro-esophageal reflux disease without esophagitis: Secondary | ICD-10-CM | POA: Diagnosis not present

## 2023-06-24 DIAGNOSIS — E559 Vitamin D deficiency, unspecified: Secondary | ICD-10-CM | POA: Diagnosis not present

## 2023-06-24 DIAGNOSIS — E785 Hyperlipidemia, unspecified: Secondary | ICD-10-CM

## 2023-06-24 DIAGNOSIS — I10 Essential (primary) hypertension: Secondary | ICD-10-CM | POA: Diagnosis not present

## 2023-06-24 DIAGNOSIS — E876 Hypokalemia: Secondary | ICD-10-CM | POA: Diagnosis not present

## 2023-06-24 LAB — COMPREHENSIVE METABOLIC PANEL WITH GFR
ALT: 10 U/L (ref 0–35)
AST: 16 U/L (ref 0–37)
Albumin: 4.2 g/dL (ref 3.5–5.2)
Alkaline Phosphatase: 135 U/L — ABNORMAL HIGH (ref 39–117)
BUN: 22 mg/dL (ref 6–23)
CO2: 29 meq/L (ref 19–32)
Calcium: 9.5 mg/dL (ref 8.4–10.5)
Chloride: 101 meq/L (ref 96–112)
Creatinine, Ser: 0.78 mg/dL (ref 0.40–1.20)
GFR: 73.3 mL/min (ref >60.00)
Glucose, Bld: 103 mg/dL — ABNORMAL HIGH (ref 70–99)
Potassium: 4.1 meq/L (ref 3.5–5.1)
Sodium: 139 meq/L (ref 135–145)
Total Bilirubin: 0.3 mg/dL (ref 0.2–1.2)
Total Protein: 7.4 g/dL (ref 6.0–8.3)

## 2023-06-24 LAB — MICROALBUMIN / CREATININE URINE RATIO
Creatinine,U: 103.9 mg/dL
Microalb Creat Ratio: 26.3 mg/g (ref 0.0–30.0)
Microalb, Ur: 2.7 mg/dL — ABNORMAL HIGH (ref 0.0–1.9)

## 2023-06-24 LAB — HEMOGLOBIN A1C: Hgb A1c MFr Bld: 6.7 % — ABNORMAL HIGH (ref 4.6–6.5)

## 2023-06-24 LAB — VITAMIN D 25 HYDROXY (VIT D DEFICIENCY, FRACTURES): VITD: 37.95 ng/mL (ref 30.00–100.00)

## 2023-06-24 MED ORDER — AMLODIPINE BESYLATE 10 MG PO TABS
10.0000 mg | ORAL_TABLET | Freq: Every day | ORAL | 1 refills | Status: DC
  Filled 2023-06-24: qty 90, 90d supply, fill #0
  Filled 2023-10-10: qty 90, 90d supply, fill #1

## 2023-06-24 MED ORDER — ATORVASTATIN CALCIUM 40 MG PO TABS
40.0000 mg | ORAL_TABLET | Freq: Every day | ORAL | 1 refills | Status: DC
  Filled 2023-06-24: qty 90, 90d supply, fill #0
  Filled 2023-10-10: qty 90, 90d supply, fill #1

## 2023-06-24 MED ORDER — OMEPRAZOLE 40 MG PO CPDR
40.0000 mg | DELAYED_RELEASE_CAPSULE | Freq: Every day | ORAL | 1 refills | Status: DC
  Filled 2023-06-24: qty 90, 90d supply, fill #0
  Filled 2023-10-10: qty 90, 90d supply, fill #1

## 2023-06-24 MED ORDER — POTASSIUM CHLORIDE CRYS ER 20 MEQ PO TBCR
20.0000 meq | EXTENDED_RELEASE_TABLET | Freq: Every day | ORAL | 1 refills | Status: DC
  Filled 2023-06-24: qty 90, 90d supply, fill #0
  Filled 2023-10-10: qty 90, 90d supply, fill #1

## 2023-06-24 MED ORDER — HYDROCHLOROTHIAZIDE 25 MG PO TABS
25.0000 mg | ORAL_TABLET | Freq: Every day | ORAL | 1 refills | Status: DC
  Filled 2023-06-24: qty 90, 90d supply, fill #0
  Filled 2023-10-10: qty 90, 90d supply, fill #1

## 2023-06-24 NOTE — Assessment & Plan Note (Signed)
 BP Readings from Last 3 Encounters:  06/24/23 137/66  04/14/23 (!) 171/90  02/25/23 132/80   BP stable on hydrochlorothiazide  and amlodipine .

## 2023-06-24 NOTE — Telephone Encounter (Signed)
 Please contact [facility] Wyvonna Heidelberg and request a copy of DM eye exam.

## 2023-06-24 NOTE — Assessment & Plan Note (Signed)
 Lab Results  Component Value Date   HGBA1C 6.8 (H) 02/25/2023   HGBA1C 6.7 (H) 06/09/2022   HGBA1C 6.5 09/28/2020   Lab Results  Component Value Date   MICROALBUR 1.0 06/09/2022   LDLCALC 85 02/25/2023   CREATININE 0.77 02/25/2023   Last A1C at goal.  Continue DM diet.  Update A1C.

## 2023-06-24 NOTE — Assessment & Plan Note (Signed)
 Lab Results  Component Value Date   CHOL 151 02/25/2023   HDL 47.80 02/25/2023   LDLCALC 85 02/25/2023   TRIG 93.0 02/25/2023   CHOLHDL 3 02/25/2023   Lipids stable on atorvastatin . Continue same.

## 2023-06-24 NOTE — Patient Instructions (Signed)
 VISIT SUMMARY:  Today, you had a routine follow-up visit. Your blood pressure is well-controlled, and your gastroesophageal reflux is manageable. We discussed your recent vitamin D  treatment and your arthritis management.  YOUR PLAN:  HYPERTENSION: Your blood pressure is well-controlled with your current medication. -Continue taking hydrochlorothiazide  and amlodipine  as prescribed.  GASTROESOPHAGEAL REFLUX DISEASE (GERD): Your symptoms are manageable with omeprazole  and dietary changes. -Continue taking omeprazole  as prescribed. -Keep avoiding red meat and sausage, and continue eating chicken and fish.  VITAMIN D  DEFICIENCY: You completed a high-dose vitamin D  course, and we need to check your levels to see if you need maintenance therapy. -Get your vitamin D  levels checked. -If your vitamin D  levels are normal, consider taking over-the-counter vitamin D  supplements.

## 2023-06-24 NOTE — Assessment & Plan Note (Signed)
 Reflux is stable on omeprazole .

## 2023-06-24 NOTE — Progress Notes (Signed)
 Subjective:     Patient ID: Jade Gross, female    DOB: [birth year], 78 y.o.   MRN: [id number]  Chief Complaint  Patient presents with   Diabetes    Here for follow up   Hypertension    Here for follow up    HPI  Discussed the use of AI scribe software for clinical note transcription with the patient, who gave verbal consent to proceed.  History of Present Illness  Jade Gross is a 78 year old female who presents for a routine follow-up visit.  Her blood pressure is well-controlled with hydrochlorothiazide  and amlodipine . She has no chest pain, shortness of breath, or palpitations.  Gastroesophageal reflux is manageable with omeprazole  and dietary modifications. She avoids red meat and sausage, opting for chicken and fish. No digestive issues are present.  She completed a 12-week course of high-dose vitamin D . Plans to check vitamin D  levels to determine if maintenance therapy is needed.  Meloxicam  was prescribed for arthritis pain but is not used regularly. It was used for four days when her elbow was swollen and red, but not painful. The elbow has since improved. No unusual muscle or joint pain is present.  An eye exam on May 20th with Dr. Wyvonna Heidelberg at [facility] showed no changes in her vision.      Health Maintenance Due  Topic Date Due   Medicare Annual Wellness (AWV)  Never done   DTaP/Tdap/Td (3 - Td or Tdap) 03/04/2021   COVID-19 Vaccine (4 - 2024-25 season) 09/28/2022   OPHTHALMOLOGY EXAM  05/20/2023   Diabetic kidney evaluation - Urine ACR  06/09/2023    Past Medical History:  Diagnosis Date   Borderline glaucoma    Eye problem    Hyperlipidemia    Hypertension     Past Surgical History:  Procedure Laterality Date   BUNIONECTOMY  ?2000   bunion removed from both feet   cataract surger/lens implant  2000   right eye   Cataract surgery, lens implant and retinal repain  2005   left eye    Family History  Problem Relation Age of Onset   Heart  disease Mother        Pacemaker at age 75   Hypertension Mother    Heart disease Father        died at 74 from MI, he had first heart attack at 54   Stroke Maternal Aunt    Heart attack Maternal Grandmother    Cancer Brother        lymphoma   Heart disease Other        died at 2 from heart attack (Brother's son)   Brain cancer Daughter     Social History   Socioeconomic History   Marital status: Divorced    Spouse name: Not on file   Number of children: 1   Years of education: Not on file   Highest education level: 12th grade  Occupational History    Employer: [employer]  Tobacco Use   Smoking status: Never   Smokeless tobacco: Never  Substance and Sexual Activity   Alcohol use: No   Drug use: No   Sexual activity: Not on file  Other Topics Concern   Not on file  Social History Narrative   Caffeine use:  <1 daily   Regular exercise:  No   Never smoked   Work at [employer] as a Merchandiser, retail in Audiological scientist   Divorced   She lives with roommate  Completed 12th grade         Social Drivers of Health   Financial Resource Strain: Low Risk  (02/24/2023)   Overall Financial Resource Strain (CARDIA)    Difficulty of Paying Living Expenses: Not hard at all  Food Insecurity: No Food Insecurity (02/24/2023)   Hunger Vital Sign    Worried About Running Out of Food in the Last Year: Never true    Ran Out of Food in the Last Year: Never true  Transportation Needs: No Transportation Needs (02/24/2023)   PRAPARE - Administrator, Civil Service (Medical): No    Lack of Transportation (Non-Medical): No  Physical Activity: Insufficiently Active (02/24/2023)   Exercise Vital Sign    Days of Exercise per Week: 2 days    Minutes of Exercise per Session: 20 min  Stress: No Stress Concern Present (02/24/2023)   [employer] of Occupational Health - Occupational Stress Questionnaire    Feeling of Stress : Not at all  Social Connections: Moderately Isolated (02/24/2023)    Social Connection and Isolation Panel [NHANES]    Frequency of Communication with Friends and Family: More than three times a week    Frequency of Social Gatherings with Friends and Family: Twice a week    Attends Religious Services: More than 4 times per year    Active Member of [employer] or Organizations: No    Attends Engineer, structural: Not on file    Marital Status: Divorced  Intimate Partner Violence: Not on file    Outpatient Medications Prior to Visit  Medication Sig Dispense Refill   aspirin 81 MG tablet Take 81 mg by mouth daily.     betamethasone  dipropionate 0.05 % cream Apply on to the skin 2 (two) times daily. 30 g 0   Calcium  Carbonate-Vitamin D  600-400 MG-UNIT tablet Take 1 tablet by mouth 2 (two) times daily.     cholecalciferol (VITAMIN D ) 1000 UNITS tablet Take 3,000 Units by mouth daily.     timolol  (TIMOPTIC ) 0.25 % ophthalmic solution Place 1 drop into both eyes every morning 10 mL 3   vitamin C (ASCORBIC ACID) 500 MG tablet Take 500 mg by mouth every morning.     amLODipine  (NORVASC ) 10 MG tablet Take 1 tablet (10 mg total) by mouth daily. 90 tablet 1   atorvastatin  (LIPITOR) 40 MG tablet Take 1 tablet (40 mg total) by mouth daily. 90 tablet 1   hydrochlorothiazide  (HYDRODIURIL ) 25 MG tablet Take 1 tablet (25 mg total) by mouth daily. 90 tablet 1   meloxicam  (MOBIC ) 7.5 MG tablet Take 1 tablet (7.5 mg total) by mouth daily as needed for pain. 30 tablet 0   omeprazole  (PRILOSEC) 40 MG capsule Take 1 capsule (40 mg total) by mouth daily. 90 capsule 1   potassium chloride  SA (KLOR-CON  M) 20 MEQ tablet Take 1 tablet (20 mEq total) by mouth daily. 90 tablet 1   sulfamethoxazole -trimethoprim  (BACTRIM  DS) 800-160 MG tablet Take 1 tablet by mouth 2 (two) times daily for 10 days. 20 tablet 0   Vitamin D , Ergocalciferol , (DRISDOL ) 1.25 MG (50000 UNIT) CAPS capsule Take 1 capsule (50,000 Units total) by mouth every 7 (seven) days. 12 capsule 0   No facility-administered  medications prior to visit.    Allergies  Allergen Reactions   Lisinopril      Hyperkalemia     ROS See HPI    Objective:     Physical Exam Constitutional:      General: She is  not in acute distress.    Appearance: Normal appearance. She is well-developed.  HENT:     Head: Normocephalic and atraumatic.     Right Ear: External ear normal.     Left Ear: External ear normal.  Eyes:     General: No scleral icterus. Neck:     Thyroid: No thyromegaly.  Cardiovascular:     Rate and Rhythm: Normal rate and regular rhythm.     Heart sounds: Normal heart sounds. No murmur heard. Pulmonary:     Effort: Pulmonary effort is normal. No respiratory distress.     Breath sounds: Normal breath sounds. No wheezing.  Musculoskeletal:     Cervical back: Neck supple.  Skin:    General: Skin is warm and dry.  Neurological:     Mental Status: She is alert and oriented to person, place, and time.  Psychiatric:        Mood and Affect: Mood normal.        Behavior: Behavior normal.        Thought Content: Thought content normal.        Judgment: Judgment normal.      BP 137/66 (BP Location: Right Arm, Patient Position: Sitting, Cuff Size: Normal)   Pulse 68   Temp 98.8 F (37.1 C) (Oral)   Resp 16   Ht 5\' 1"  (1.549 m)   Wt 164 lb (74.4 kg)   LMP 02/05/1996   SpO2 97%   BMI 30.99 kg/m  Wt Readings from Last 3 Encounters:  06/24/23 164 lb (74.4 kg)  04/14/23 164 lb 12.8 oz (74.8 kg)  02/25/23 164 lb (74.4 kg)       Assessment & Plan:   Problem List Items Addressed This Visit       Unprioritized   Vitamin D  deficiency   Completed 12 week course of vit D 50000 international units daily.       Relevant Orders   VITAMIN D  25 Hydroxy (Vit-D Deficiency, Fractures)   HYPERTENSION, BENIGN SYSTEMIC   BP Readings from Last 3 Encounters:  06/24/23 137/66  04/14/23 (!) 171/90  02/25/23 132/80   BP stable on hydrochlorothiazide  and amlodipine .        Relevant Medications    atorvastatin  (LIPITOR) 40 MG tablet   hydrochlorothiazide  (HYDRODIURIL ) 25 MG tablet   amLODipine  (NORVASC ) 10 MG tablet   Hyperlipidemia   Lab Results  Component Value Date   CHOL 151 02/25/2023   HDL 47.80 02/25/2023   LDLCALC 85 02/25/2023   TRIG 93.0 02/25/2023   CHOLHDL 3 02/25/2023   Lipids stable on atorvastatin . Continue same.       Relevant Medications   atorvastatin  (LIPITOR) 40 MG tablet   hydrochlorothiazide  (HYDRODIURIL ) 25 MG tablet   amLODipine  (NORVASC ) 10 MG tablet   GASTROESOPHAGEAL REFLUX, NO ESOPHAGITIS   Reflux is stable on omeprazole .       Relevant Medications   omeprazole  (PRILOSEC) 40 MG capsule   Diabetes type 2, controlled (HCC) - Primary   Lab Results  Component Value Date   HGBA1C 6.8 (H) 02/25/2023   HGBA1C 6.7 (H) 06/09/2022   HGBA1C 6.5 09/28/2020   Lab Results  Component Value Date   MICROALBUR 1.0 06/09/2022   LDLCALC 85 02/25/2023   CREATININE 0.77 02/25/2023   Last A1C at goal.  Continue DM diet.  Update A1C.      Relevant Medications   atorvastatin  (LIPITOR) 40 MG tablet   Other Relevant Orders   HgB A1c   Urine Microalbumin  w/creat. ratio   Comp Met (CMET)   Other Visit Diagnoses       Hypokalemia       Relevant Medications   potassium chloride  SA (KLOR-CON  M) 20 MEQ tablet       I have discontinued Chantee F. Wenk's Vitamin D  (Ergocalciferol ), meloxicam , and sulfamethoxazole -trimethoprim . I am also having her maintain her cholecalciferol, Calcium  Carbonate-Vitamin D , aspirin, ascorbic acid, betamethasone  dipropionate, timolol , omeprazole , atorvastatin , hydrochlorothiazide , amLODipine , and potassium chloride  SA.  Meds ordered this encounter  Medications   omeprazole  (PRILOSEC) 40 MG capsule    Sig: Take 1 capsule (40 mg total) by mouth daily.    Dispense:  90 capsule    Refill:  1    Supervising Provider:   Randie Bustle A [[id number]]   atorvastatin  (LIPITOR) 40 MG tablet    Sig: Take 1 tablet (40 mg total) by mouth  daily.    Dispense:  90 tablet    Refill:  1    Supervising Provider:   Randie Bustle A [[id number]]   hydrochlorothiazide  (HYDRODIURIL ) 25 MG tablet    Sig: Take 1 tablet (25 mg total) by mouth daily.    Dispense:  90 tablet    Refill:  1    Supervising Provider:   Randie Bustle A [[id number]]   amLODipine  (NORVASC ) 10 MG tablet    Sig: Take 1 tablet (10 mg total) by mouth daily.    Dispense:  90 tablet    Refill:  1    Supervising Provider:   Randie Bustle A [[id number]]   potassium chloride  SA (KLOR-CON  M) 20 MEQ tablet    Sig: Take 1 tablet (20 mEq total) by mouth daily.    Dispense:  90 tablet    Refill:  1    Supervising Provider:   Randie Bustle A [[id number]]

## 2023-06-24 NOTE — Assessment & Plan Note (Signed)
 Completed 12 week course of vit D 50000 international units daily.

## 2023-06-25 ENCOUNTER — Telehealth: Payer: Self-pay | Admitting: Family

## 2023-06-25 DIAGNOSIS — E119 Type 2 diabetes mellitus without complications: Secondary | ICD-10-CM

## 2023-06-25 DIAGNOSIS — R748 Abnormal levels of other serum enzymes: Secondary | ICD-10-CM

## 2023-06-25 NOTE — Telephone Encounter (Signed)
 Called patient but no answer, left voice mail for patient to call back.

## 2023-06-25 NOTE — Addendum Note (Signed)
 Addended by: Dorrene Gaucher on: 06/25/2023 02:25 PM   Modules accepted: Level of Service

## 2023-06-25 NOTE — Telephone Encounter (Signed)
 Electronic request made

## 2023-06-25 NOTE — Telephone Encounter (Signed)
 Some protein in her urine.  Please add lisinopril  2.5mg  once daily for kidney protection.  Repeat bmet in 1-2 weeks.  Sugar is at goal.  Vitamin D  is normal.  Continue otc vit D 3000 international units daily

## 2023-09-30 ENCOUNTER — Ambulatory Visit: Admitting: Family

## 2023-10-07 ENCOUNTER — Ambulatory Visit: Admitting: Family

## 2023-10-10 ENCOUNTER — Other Ambulatory Visit (HOSPITAL_BASED_OUTPATIENT_CLINIC_OR_DEPARTMENT_OTHER): Payer: Self-pay

## 2023-10-12 ENCOUNTER — Other Ambulatory Visit: Payer: Self-pay

## 2023-10-13 ENCOUNTER — Other Ambulatory Visit (HOSPITAL_BASED_OUTPATIENT_CLINIC_OR_DEPARTMENT_OTHER): Payer: Self-pay

## 2023-10-13 ENCOUNTER — Other Ambulatory Visit: Payer: Self-pay

## 2023-10-13 MED ORDER — TIMOLOL MALEATE 0.25 % OP SOLN
1.0000 [drp] | OPHTHALMIC | 3 refills | Status: AC
  Filled 2023-10-13: qty 10, 100d supply, fill #0
  Filled 2023-10-14: qty 5, 50d supply, fill #0
  Filled 2023-12-10: qty 10, 200d supply, fill #1
  Filled 2023-12-10: qty 10, 100d supply, fill #1
  Filled 2024-03-02: qty 10, 100d supply, fill #2

## 2023-10-14 ENCOUNTER — Ambulatory Visit (INDEPENDENT_AMBULATORY_CARE_PROVIDER_SITE_OTHER): Admitting: Family

## 2023-10-14 ENCOUNTER — Other Ambulatory Visit: Payer: Self-pay

## 2023-10-14 ENCOUNTER — Ambulatory Visit: Payer: Self-pay | Admitting: Family

## 2023-10-14 ENCOUNTER — Other Ambulatory Visit (HOSPITAL_BASED_OUTPATIENT_CLINIC_OR_DEPARTMENT_OTHER): Payer: Self-pay

## 2023-10-14 ENCOUNTER — Telehealth: Payer: Self-pay | Admitting: Family

## 2023-10-14 VITALS — BP 142/67 | HR 68 | Temp 98.6°F | Resp 16 | Ht 61.0 in | Wt 167.0 lb

## 2023-10-14 DIAGNOSIS — I10 Essential (primary) hypertension: Secondary | ICD-10-CM | POA: Diagnosis not present

## 2023-10-14 DIAGNOSIS — K219 Gastro-esophageal reflux disease without esophagitis: Secondary | ICD-10-CM | POA: Diagnosis not present

## 2023-10-14 DIAGNOSIS — E785 Hyperlipidemia, unspecified: Secondary | ICD-10-CM | POA: Diagnosis not present

## 2023-10-14 DIAGNOSIS — E119 Type 2 diabetes mellitus without complications: Secondary | ICD-10-CM | POA: Diagnosis not present

## 2023-10-14 DIAGNOSIS — R809 Proteinuria, unspecified: Secondary | ICD-10-CM

## 2023-10-14 DIAGNOSIS — E559 Vitamin D deficiency, unspecified: Secondary | ICD-10-CM | POA: Diagnosis not present

## 2023-10-14 DIAGNOSIS — E876 Hypokalemia: Secondary | ICD-10-CM

## 2023-10-14 LAB — BASIC METABOLIC PANEL WITH GFR
BUN: 23 mg/dL (ref 6–23)
CO2: 30 meq/L (ref 19–32)
Calcium: 9.8 mg/dL (ref 8.4–10.5)
Chloride: 100 meq/L (ref 96–112)
Creatinine, Ser: 0.74 mg/dL (ref 0.40–1.20)
GFR: 77.91 mL/min (ref >60.00)
Glucose, Bld: 99 mg/dL (ref 70–99)
Potassium: 4.8 meq/L (ref 3.5–5.1)
Sodium: 139 meq/L (ref 135–145)

## 2023-10-14 LAB — HEMOGLOBIN A1C: Hgb A1c MFr Bld: 6.9 % — ABNORMAL HIGH (ref 4.6–6.5)

## 2023-10-14 MED ORDER — POTASSIUM CHLORIDE CRYS ER 20 MEQ PO TBCR
20.0000 meq | EXTENDED_RELEASE_TABLET | Freq: Every day | ORAL | 1 refills | Status: DC
  Filled 2023-10-14 – 2024-01-05 (×2): qty 90, 90d supply, fill #0

## 2023-10-14 MED ORDER — OMEPRAZOLE 40 MG PO CPDR
40.0000 mg | DELAYED_RELEASE_CAPSULE | Freq: Every day | ORAL | 1 refills | Status: AC
  Filled 2023-10-14 – 2024-01-05 (×2): qty 90, 90d supply, fill #0

## 2023-10-14 MED ORDER — ATORVASTATIN CALCIUM 40 MG PO TABS
40.0000 mg | ORAL_TABLET | Freq: Every day | ORAL | 1 refills | Status: AC
  Filled 2023-10-14 – 2024-01-05 (×2): qty 90, 90d supply, fill #0

## 2023-10-14 NOTE — Assessment & Plan Note (Signed)
 Restart supplement 3000 international units daily of vit D.

## 2023-10-14 NOTE — Assessment & Plan Note (Signed)
 Lab Results  Component Value Date   HGBA1C 6.7 (H) 06/24/2023   HGBA1C 6.8 (H) 02/25/2023   HGBA1C 6.7 (H) 06/09/2022   Lab Results  Component Value Date   MICROALBUR 2.7 (H) 06/24/2023   LDLCALC 85 02/25/2023   CREATININE 0.78 06/24/2023   Diet controlled.  Update A1C.

## 2023-10-14 NOTE — Telephone Encounter (Signed)
 Please request copy of DM eye exam from Dr. Lita.

## 2023-10-14 NOTE — Progress Notes (Signed)
 Subjective:     Patient ID: Jade Gross, female    DOB: 25-Apr-1945, 78 y.o.   MRN: [id number]  Chief Complaint  Patient presents with   Diabetes    Here for follow up   Hypertension    Here for follow up    Diabetes  Hypertension    Discussed the use of AI scribe software for clinical note transcription with the patient, who gave verbal consent to proceed.  History of Present Illness  Jade Gross is a 78 year old female with hypertension and diabetes who presents for a routine follow-up and medication review.  She experiences a sore left foot, causing pain during ambulation and occasionally hindering her ability to walk. There is no recent injury, but she recalls setting a dresser on her toe and hitting it on the dashboard. She uses Tylenol and Voltaren gel for pain relief.  She is on hydrochlorothiazide  and amlodipine  10 mg for hypertension, with blood pressure described as slightly elevated. She has diabetes with proteinuria and is not on lisinopril  due to past elevated potassium levels.  She has a history of vitamin D  deficiency and is supposed to take 3000 IU daily but has not been consistent. Her vitamin D  levels were adequate after a high-dose 12-week course.  She takes omeprazole  for reflux and has reduced red meat intake, which she believes has helped with her symptoms.     Health Maintenance Due  Topic Date Due   Medicare Annual Wellness (AWV)  Never done   DTaP/Tdap/Td (3 - Td or Tdap) 03/04/2021   OPHTHALMOLOGY EXAM  05/20/2023   Influenza Vaccine  08/28/2023   COVID-19 Vaccine (4 - 2025-26 season) 09/28/2023    Past Medical History:  Diagnosis Date   Borderline glaucoma    Eye problem    Hyperlipidemia    Hypertension     Past Surgical History:  Procedure Laterality Date   BUNIONECTOMY  ?2000   bunion removed from both feet   cataract surger/lens implant  2000   right eye   Cataract surgery, lens implant and retinal repain  2005   left eye     Family History  Problem Relation Age of Onset   Heart disease Mother        Pacemaker at age 78   Hypertension Mother    Heart disease Father        died at 66 from MI, he had first heart attack at 52   Stroke Maternal Aunt    Heart attack Maternal Grandmother    Cancer Brother        lymphoma   Heart disease Other        died at 47 from heart attack (Brother's son)   Brain cancer Daughter     Social History   Socioeconomic History   Marital status: Divorced    Spouse name: Not on file   Number of children: 1   Years of education: Not on file   Highest education level: 12th grade  Occupational History    Employer: [employer]  Tobacco Use   Smoking status: Never   Smokeless tobacco: Never  Substance and Sexual Activity   Alcohol use: No   Drug use: No   Sexual activity: Not on file  Other Topics Concern   Not on file  Social History Narrative   Caffeine use:  <1 daily   Regular exercise:  No   Never smoked   Work at [employer] as a Merchandiser, retail in  accounting   Divorced   She lives with roommate   Completed 12th grade         Social Drivers of Corporate investment banker Strain: Low Risk  (10/13/2023)   Overall Financial Resource Strain (CARDIA)    Difficulty of Paying Living Expenses: Not hard at all  Food Insecurity: No Food Insecurity (10/13/2023)   Hunger Vital Sign    Worried About Running Out of Food in the Last Year: Never true    Ran Out of Food in the Last Year: Never true  Transportation Needs: No Transportation Needs (10/13/2023)   PRAPARE - Administrator, Civil Service (Medical): No    Lack of Transportation (Non-Medical): No  Physical Activity: Insufficiently Active (10/13/2023)   Exercise Vital Sign    Days of Exercise per Week: 5 days    Minutes of Exercise per Session: 10 min  Stress: No Stress Concern Present (10/13/2023)   [employer] of Occupational Health - Occupational Stress Questionnaire    Feeling of Stress: Not at all   Social Connections: Moderately Integrated (10/13/2023)   Social Connection and Isolation Panel    Frequency of Communication with Friends and Family: More than three times a week    Frequency of Social Gatherings with Friends and Family: Three times a week    Attends Religious Services: More than 4 times per year    Active Member of Clubs or Organizations: Yes    Attends Engineer, structural: More than 4 times per year    Marital Status: Divorced  Catering manager Violence: Not on file    Outpatient Medications Prior to Visit  Medication Sig Dispense Refill   amLODipine  (NORVASC ) 10 MG tablet Take 1 tablet (10 mg total) by mouth daily. 90 tablet 1   aspirin 81 MG tablet Take 81 mg by mouth daily.     betamethasone  dipropionate 0.05 % cream Apply on to the skin 2 (two) times daily. 30 g 0   Calcium  Carbonate-Vitamin D  600-400 MG-UNIT tablet Take 1 tablet by mouth 2 (two) times daily.     cholecalciferol (VITAMIN D ) 1000 UNITS tablet Take 3,000 Units by mouth daily.     hydrochlorothiazide  (HYDRODIURIL ) 25 MG tablet Take 1 tablet (25 mg total) by mouth daily. 90 tablet 1   timolol  (TIMOPTIC ) 0.25 % ophthalmic solution Place 1 drop into both eyes every morning. 10 mL 3   vitamin C (ASCORBIC ACID) 500 MG tablet Take 500 mg by mouth every morning.     atorvastatin  (LIPITOR) 40 MG tablet Take 1 tablet (40 mg total) by mouth daily. 90 tablet 1   omeprazole  (PRILOSEC) 40 MG capsule Take 1 capsule (40 mg total) by mouth daily. 90 capsule 1   potassium chloride  SA (KLOR-CON  M) 20 MEQ tablet Take 1 tablet (20 mEq total) by mouth daily. 90 tablet 1   No facility-administered medications prior to visit.    Allergies  Allergen Reactions   Lisinopril      Hyperkalemia     ROS See HPI    Objective:    Physical Exam Constitutional:      General: She is not in acute distress.    Appearance: Normal appearance. She is well-developed.  HENT:     Head: Normocephalic and atraumatic.      Right Ear: External ear normal.     Left Ear: External ear normal.  Eyes:     General: No scleral icterus. Neck:     Thyroid: No thyromegaly.  Cardiovascular:     Rate and Rhythm: Normal rate and regular rhythm.     Heart sounds: Normal heart sounds. No murmur heard. Pulmonary:     Effort: Pulmonary effort is normal. No respiratory distress.     Breath sounds: Normal breath sounds. No wheezing.  Musculoskeletal:     Cervical back: Neck supple.  Skin:    General: Skin is warm and dry.  Neurological:     Mental Status: She is alert and oriented to person, place, and time.  Psychiatric:        Mood and Affect: Mood normal.        Behavior: Behavior normal.        Thought Content: Thought content normal.        Judgment: Judgment normal.      BP (!) 142/67 (BP Location: Right Arm, Patient Position: Sitting, Cuff Size: Small)   Pulse 68   Temp 98.6 F (37 C) (Oral)   Resp 16   Ht 5' 1 (1.549 m)   Wt 167 lb (75.8 kg)   LMP 02/05/1996   SpO2 95%   BMI 31.55 kg/m  Wt Readings from Last 3 Encounters:  10/14/23 167 lb (75.8 kg)  06/24/23 164 lb (74.4 kg)  04/14/23 164 lb 12.8 oz (74.8 kg)       Assessment & Plan:   Problem List Items Addressed This Visit       Unprioritized   Vitamin D  deficiency - Primary   Restart supplement 3000 international units daily of vit D.        Microalbuminuria   Had hyperkalemia with ACE, unfortunately farxiga is not on her formulary.      HYPERTENSION, BENIGN SYSTEMIC   BP Readings from Last 3 Encounters:  10/14/23 (!) 142/67  06/24/23 137/66  04/14/23 (!) 171/90   Maintained on amlodipine  and hydrochlorothiazide .  Up a bit today. She will check daily at home x 1 week and send me her readings.      Relevant Medications   atorvastatin  (LIPITOR) 40 MG tablet   Hyperlipidemia   Lab Results  Component Value Date   CHOL 151 02/25/2023   HDL 47.80 02/25/2023   LDLCALC 85 02/25/2023   TRIG 93.0 02/25/2023   CHOLHDL 3  02/25/2023   Lipids stable on atorvastatin .       Relevant Medications   atorvastatin  (LIPITOR) 40 MG tablet   GASTROESOPHAGEAL REFLUX, NO ESOPHAGITIS   Stable on omeprazole . Continue same.       Relevant Medications   omeprazole  (PRILOSEC) 40 MG capsule   Diabetes type 2, controlled (HCC)   Lab Results  Component Value Date   HGBA1C 6.7 (H) 06/24/2023   HGBA1C 6.8 (H) 02/25/2023   HGBA1C 6.7 (H) 06/09/2022   Lab Results  Component Value Date   MICROALBUR 2.7 (H) 06/24/2023   LDLCALC 85 02/25/2023   CREATININE 0.78 06/24/2023   Diet controlled.  Update A1C.        Relevant Medications   atorvastatin  (LIPITOR) 40 MG tablet   Other Relevant Orders   HgB A1c   Basic Metabolic Panel (BMET)   Other Visit Diagnoses       Hypokalemia       Relevant Medications   potassium chloride  SA (KLOR-CON  M) 20 MEQ tablet       I am having Jade FALCON. Quinonez maintain her cholecalciferol, Calcium  Carbonate-Vitamin D , aspirin, ascorbic acid, betamethasone  dipropionate, hydrochlorothiazide , amLODipine , timolol , omeprazole , atorvastatin , and potassium chloride  SA.  Meds ordered this encounter  Medications   omeprazole  (PRILOSEC) 40 MG capsule    Sig: Take 1 capsule (40 mg total) by mouth daily.    Dispense:  90 capsule    Refill:  1    Supervising Provider:   DOMENICA BLACKBIRD A [[id number]]   atorvastatin  (LIPITOR) 40 MG tablet    Sig: Take 1 tablet (40 mg total) by mouth daily.    Dispense:  90 tablet    Refill:  1    Supervising Provider:   DOMENICA BLACKBIRD A [[id number]]   potassium chloride  SA (KLOR-CON  M) 20 MEQ tablet    Sig: Take 1 tablet (20 mEq total) by mouth daily.    Dispense:  90 tablet    Refill:  1    Supervising Provider:   DOMENICA BLACKBIRD A [[id number]]

## 2023-10-14 NOTE — Assessment & Plan Note (Signed)
 BP Readings from Last 3 Encounters:  10/14/23 (!) 142/67  06/24/23 137/66  04/14/23 (!) 171/90   Maintained on amlodipine  and hydrochlorothiazide .  Up a bit today. She will check daily at home x 1 week and send me her readings.

## 2023-10-14 NOTE — Patient Instructions (Signed)
 VISIT SUMMARY:  You came in for a routine follow-up and medication review. We discussed your hypertension, diabetes, left foot pain, GERD, and vitamin D  deficiency.  YOUR PLAN:  TYPE 2 DIABETES MELLITUS WITH CHRONIC KIDNEY DISEASE: You have diabetes with kidney issues, likely due to diabetes. We discussed medication options, but some are not suitable due to cost or side effects. -We will reassess medication options for kidney protection in the future.  HYPERTENSION: Your blood pressure is slightly elevated. You are currently on hydrochlorothiazide  and amlodipine . -Monitor your blood pressure at home once daily for a week and send us  the readings.  LEFT FOOT PAIN: You have chronic pain in your left foot, likely due to arthritis, which worsens with walking. -Continue using Tylenol for pain management. -Use Voltaren gel for topical relief. -Consider seeing a podiatrist if your symptoms worsen.  GASTROESOPHAGEAL REFLUX DISEASE (GERD): Your GERD is well-controlled with your current medication. -Continue your current omeprazole  regimen.  VITAMIN D  DEFICIENCY: You have a history of vitamin D  deficiency and have not been consistent with your supplementation. -Restart taking vitamin D  3000 IU daily.

## 2023-10-14 NOTE — Telephone Encounter (Signed)
 Electronic request sent

## 2023-10-14 NOTE — Assessment & Plan Note (Signed)
 Lab Results  Component Value Date   CHOL 151 02/25/2023   HDL 47.80 02/25/2023   LDLCALC 85 02/25/2023   TRIG 93.0 02/25/2023   CHOLHDL 3 02/25/2023   Lipids stable on atorvastatin .

## 2023-10-14 NOTE — Assessment & Plan Note (Signed)
 Had hyperkalemia with ACE, unfortunately farxiga is not on her formulary.

## 2023-10-14 NOTE — Assessment & Plan Note (Signed)
Stable on omeprazole. Continue same.  ?

## 2023-10-16 ENCOUNTER — Other Ambulatory Visit (HOSPITAL_BASED_OUTPATIENT_CLINIC_OR_DEPARTMENT_OTHER): Payer: Self-pay

## 2023-12-10 ENCOUNTER — Other Ambulatory Visit (HOSPITAL_BASED_OUTPATIENT_CLINIC_OR_DEPARTMENT_OTHER): Payer: Self-pay

## 2023-12-10 ENCOUNTER — Other Ambulatory Visit: Payer: Self-pay

## 2023-12-11 ENCOUNTER — Other Ambulatory Visit: Payer: Self-pay

## 2023-12-11 ENCOUNTER — Other Ambulatory Visit (HOSPITAL_BASED_OUTPATIENT_CLINIC_OR_DEPARTMENT_OTHER): Payer: Self-pay

## 2023-12-31 ENCOUNTER — Ambulatory Visit: Payer: Self-pay

## 2023-12-31 ENCOUNTER — Other Ambulatory Visit (HOSPITAL_BASED_OUTPATIENT_CLINIC_OR_DEPARTMENT_OTHER): Payer: Self-pay

## 2023-12-31 ENCOUNTER — Ambulatory Visit: Admitting: Urgent Care

## 2023-12-31 VITALS — BP 142/82 | HR 72 | Ht 61.0 in | Wt 167.0 lb

## 2023-12-31 DIAGNOSIS — R300 Dysuria: Secondary | ICD-10-CM

## 2023-12-31 DIAGNOSIS — N3000 Acute cystitis without hematuria: Secondary | ICD-10-CM

## 2023-12-31 LAB — POCT URINALYSIS DIP (CLINITEK)
Bilirubin, UA: NEGATIVE
Glucose, UA: NEGATIVE mg/dL
Ketones, POC UA: NEGATIVE mg/dL
Nitrite, UA: NEGATIVE
POC PROTEIN,UA: NEGATIVE
Spec Grav, UA: <=1.005 — AB (ref 1.010–1.025)
Urobilinogen, UA: 0.2 U/dL
pH, UA: 5.5 (ref 5.0–8.0)

## 2023-12-31 MED ORDER — AMOXICILLIN-POT CLAVULANATE 500-125 MG PO TABS
1.0000 | ORAL_TABLET | Freq: Two times a day (BID) | ORAL | 0 refills | Status: DC
  Filled 2023-12-31: qty 14, 7d supply, fill #0

## 2023-12-31 NOTE — Progress Notes (Signed)
 Established Patient Office Visit  Subjective:  Patient ID: Jade Gross, female    DOB: 02/15/1945  Age: 78 y.o. MRN: [id number]  Chief Complaint  Patient presents with   Dysuria    Started this morning    Dysuria  This is a new problem. The current episode started today. The problem occurs every urination. The problem has been gradually worsening. The quality of the pain is described as aching, burning and shooting. The pain is mild. There has been no fever. There is No history of pyelonephritis. Associated symptoms include frequency and urgency. Pertinent negatives include no chills, discharge, flank pain, hematuria, hesitancy, nausea, sweats or vomiting. She has tried nothing for the symptoms.    Patient Active Problem List   Diagnosis Date Noted   Microalbuminuria 10/14/2023   Skin rash 02/25/2023   Skin lesion 09/28/2020   Eczema 09/28/2020   Nodule of finger of left hand 07/24/2014   Hand pain 04/23/2014   Dry skin 02/02/2013   Diabetes type 2, controlled (HCC) 09/03/2011   Vitamin D  deficiency 09/02/2011   Osteopenia 04/29/2011   General medical examination 03/05/2011   UTERINE FIBROID 03/26/2006   Hyperlipidemia 03/26/2006   HYPERTENSION, BENIGN SYSTEMIC 03/26/2006   GASTROESOPHAGEAL REFLUX, NO ESOPHAGITIS 03/26/2006   MENOPAUSAL SYNDROME 03/26/2006   Past Medical History:  Diagnosis Date   Borderline glaucoma    Eye problem    Hyperlipidemia    Hypertension    Past Surgical History:  Procedure Laterality Date   BUNIONECTOMY  ?2000   bunion removed from both feet   cataract surger/lens implant  2000   right eye   Cataract surgery, lens implant and retinal repain  2005   left eye   Social History   Tobacco Use   Smoking status: Never   Smokeless tobacco: Never  Substance Use Topics   Alcohol use: No   Drug use: No      ROS: as noted in HPI  Objective:     BP (!) 142/82   Pulse 72   Ht 5' 1 (1.549 m)   Wt 167 lb (75.8 kg)   LMP 02/05/1996    SpO2 94%   BMI 31.55 kg/m  BP Readings from Last 3 Encounters:  12/31/23 (!) 142/82  10/14/23 (!) 142/67  06/24/23 137/66   Wt Readings from Last 3 Encounters:  12/31/23 167 lb (75.8 kg)  10/14/23 167 lb (75.8 kg)  06/24/23 164 lb (74.4 kg)      Physical Exam Vitals and nursing note reviewed.  Constitutional:      General: She is not in acute distress.    Appearance: Normal appearance. She is not ill-appearing, toxic-appearing or diaphoretic.  HENT:     Head: Normocephalic and atraumatic.  Eyes:     General:        Right eye: No discharge.        Left eye: No discharge.     Extraocular Movements: Extraocular movements intact.     Pupils: Pupils are equal, round, and reactive to light.  Cardiovascular:     Rate and Rhythm: Normal rate.  Pulmonary:     Effort: Pulmonary effort is normal. No respiratory distress.  Abdominal:     General: Abdomen is flat. There is no distension.     Palpations: Abdomen is soft.     Tenderness: There is no abdominal tenderness. There is no right CVA tenderness, left CVA tenderness, guarding or rebound.  Skin:    General: Skin is warm and  dry.     Coloration: Skin is not jaundiced.     Findings: No bruising, erythema or rash.  Neurological:     Mental Status: She is alert and oriented to person, place, and time.      Results for orders placed or performed in visit on 12/31/23  POCT URINALYSIS DIP (CLINITEK)  Result Value Ref Range   Color, UA yellow yellow   Clarity, UA cloudy (A) clear   Glucose, UA negative negative mg/dL   Bilirubin, UA negative negative   Ketones, POC UA negative negative mg/dL   Spec Grav, UA <=8.994 (A) 1.010 - 1.025   Blood, UA large (A) negative   pH, UA 5.5 5.0 - 8.0   POC PROTEIN,UA negative negative, trace   Urobilinogen, UA 0.2 0.2 or 1.0 E.U./dL   Nitrite, UA Negative Negative   Leukocytes, UA Small (1+) (A) Negative     The 10-year ASCVD risk score (Arnett DK, et al., 2019) is:  50.1%  Assessment & Plan:  Dysuria -     POCT URINALYSIS DIP (CLINITEK) -     Amoxicillin -Pot Clavulanate; Take 1 tablet by mouth in the morning and at bedtime.  Dispense: 14 tablet; Refill: 0 -     Urine Culture  Acute cystitis without hematuria -     POCT URINALYSIS DIP (CLINITEK) -     Amoxicillin -Pot Clavulanate; Take 1 tablet by mouth in the morning and at bedtime.  Dispense: 14 tablet; Refill: 0 -     Urine Culture  Sx started this morning. UA dip consistent with acute cystitis. Tx as above   No follow-ups on file.   Benton LITTIE Gave, PA

## 2023-12-31 NOTE — Progress Notes (Signed)
 SABRA

## 2023-12-31 NOTE — Patient Instructions (Signed)
 Your symptoms are consistent with a urinary tract infection. Start taking the antibiotic twice daily until completed.  You can take OtC AZO if desired up to 3 times daily for maximum of 2 days, this is only to help with the frequency and the pain.  Take on an as-needed basis.  We will send out your urine culture and only call if a change in treatment is necessary.

## 2023-12-31 NOTE — Telephone Encounter (Signed)
 Appt scheduled

## 2023-12-31 NOTE — Telephone Encounter (Signed)
 FYI Only or Action Required?: FYI only for provider: appointment scheduled on today.  Patient was last seen in primary care on 10/14/2023 by Daryl Setter, NP.  Called Nurse Triage reporting Urinary Urgency.  Symptoms began today.  Interventions attempted: Nothing.  Symptoms are: gradually worsening.  Triage Disposition: See HCP Within 4 Hours (Or PCP Triage)  Patient/caregiver understands and will follow disposition?: Yes, will follow disposition  Copied from CRM #[id number]. Topic: Clinical - Red Word Triage >> Dec 31, 2023 11:33 AM Suzen RAMAN wrote: Red Word that prompted transfer to Nurse Triage: painful,burning,urination, urgency but only a drop comes out; requesting an appt Reason for Disposition  Side (flank) or lower back pain present  Answer Assessment - Initial Assessment Questions 1. SEVERITY: How bad is the pain?  (e.g., Scale 1-10; mild, moderate, or severe)     Pretty bad 2. FREQUENCY: How many times have you had painful urination today?      Each time she pees, 4-5 times so far today 3. PATTERN: Is pain present every time you urinate or just sometimes?      yes 4. ONSET: When did the painful urination start?      Started today 5. FEVER: Do you have a fever? If Yes, ask: What is your temperature, how was it measured, and when did it start?     denies 6. PAST UTI: Have you had a urine infection before? If Yes, ask: When was the last time? and What happened that time?      Only one 7. CAUSE: What do you think is causing the painful urination?  (e.g., UTI, scratch, Herpes sore)     UTI 8. OTHER SYMPTOMS: Do you have any other symptoms? (e.g., blood in urine, flank pain, genital sores, urgency, vaginal discharge)     denies  Protocols used: Urination Pain - Female-A-AH

## 2024-01-02 ENCOUNTER — Ambulatory Visit: Payer: Self-pay | Admitting: Urgent Care

## 2024-01-02 LAB — URINE CULTURE

## 2024-01-05 ENCOUNTER — Other Ambulatory Visit: Payer: Self-pay

## 2024-01-05 ENCOUNTER — Other Ambulatory Visit: Payer: Self-pay | Admitting: Family

## 2024-01-05 ENCOUNTER — Other Ambulatory Visit (HOSPITAL_BASED_OUTPATIENT_CLINIC_OR_DEPARTMENT_OTHER): Payer: Self-pay

## 2024-01-05 DIAGNOSIS — I10 Essential (primary) hypertension: Secondary | ICD-10-CM

## 2024-01-05 MED ORDER — AMLODIPINE BESYLATE 10 MG PO TABS
10.0000 mg | ORAL_TABLET | Freq: Every day | ORAL | 1 refills | Status: AC
  Filled 2024-01-05: qty 90, 90d supply, fill #0

## 2024-01-05 MED ORDER — HYDROCHLOROTHIAZIDE 25 MG PO TABS
25.0000 mg | ORAL_TABLET | Freq: Every day | ORAL | 1 refills | Status: DC
  Filled 2024-01-05: qty 90, 90d supply, fill #0

## 2024-02-17 ENCOUNTER — Ambulatory Visit (INDEPENDENT_AMBULATORY_CARE_PROVIDER_SITE_OTHER): Admitting: Family

## 2024-02-17 ENCOUNTER — Other Ambulatory Visit: Payer: Self-pay

## 2024-02-17 ENCOUNTER — Other Ambulatory Visit (HOSPITAL_BASED_OUTPATIENT_CLINIC_OR_DEPARTMENT_OTHER): Payer: Self-pay

## 2024-02-17 ENCOUNTER — Ambulatory Visit: Payer: Self-pay | Admitting: Family

## 2024-02-17 VITALS — BP 132/73 | HR 75 | Temp 98.6°F | Resp 16 | Ht 62.0 in | Wt 165.0 lb

## 2024-02-17 DIAGNOSIS — E782 Mixed hyperlipidemia: Secondary | ICD-10-CM

## 2024-02-17 DIAGNOSIS — L309 Dermatitis, unspecified: Secondary | ICD-10-CM

## 2024-02-17 DIAGNOSIS — R809 Proteinuria, unspecified: Secondary | ICD-10-CM | POA: Diagnosis not present

## 2024-02-17 DIAGNOSIS — E119 Type 2 diabetes mellitus without complications: Secondary | ICD-10-CM | POA: Diagnosis not present

## 2024-02-17 DIAGNOSIS — R21 Rash and other nonspecific skin eruption: Secondary | ICD-10-CM

## 2024-02-17 DIAGNOSIS — I10 Essential (primary) hypertension: Secondary | ICD-10-CM

## 2024-02-17 LAB — COMPREHENSIVE METABOLIC PANEL WITH GFR
ALT: 11 U/L (ref 3–35)
AST: 15 U/L (ref 5–37)
Albumin: 4.4 g/dL (ref 3.5–5.2)
Alkaline Phosphatase: 114 U/L (ref 39–117)
BUN: 21 mg/dL (ref 6–23)
CO2: 33 meq/L — ABNORMAL HIGH (ref 19–32)
Calcium: 10 mg/dL (ref 8.4–10.5)
Chloride: 98 meq/L (ref 96–112)
Creatinine, Ser: 0.82 mg/dL (ref 0.40–1.20)
GFR: 68.71 mL/min
Glucose, Bld: 102 mg/dL — ABNORMAL HIGH (ref 70–99)
Potassium: 3.7 meq/L (ref 3.5–5.1)
Sodium: 139 meq/L (ref 135–145)
Total Bilirubin: 0.5 mg/dL (ref 0.2–1.2)
Total Protein: 7.4 g/dL (ref 6.0–8.3)

## 2024-02-17 LAB — HEMOGLOBIN A1C: Hgb A1c MFr Bld: 6.5 % (ref 4.6–6.5)

## 2024-02-17 LAB — LIPID PANEL
Cholesterol: 158 mg/dL (ref 28–200)
HDL: 48.7 mg/dL
LDL Cholesterol: 86 mg/dL (ref 10–99)
NonHDL: 109.7
Total CHOL/HDL Ratio: 3
Triglycerides: 119 mg/dL (ref 10.0–149.0)
VLDL: 23.8 mg/dL (ref 0.0–40.0)

## 2024-02-17 MED ORDER — LANCET DEVICE MISC
1.0000 | 0 refills | Status: AC
  Filled 2024-02-17: qty 1, fill #0

## 2024-02-17 MED ORDER — LANCETS MISC
1.0000 | 0 refills | Status: AC
  Filled 2024-02-17: qty 100, 25d supply, fill #0

## 2024-02-17 MED ORDER — BLOOD GLUCOSE TEST VI STRP
1.0000 | ORAL_STRIP | 0 refills | Status: AC
  Filled 2024-02-17: qty 100, 25d supply, fill #0

## 2024-02-17 MED ORDER — VALSARTAN 80 MG PO TABS
80.0000 mg | ORAL_TABLET | Freq: Every day | ORAL | 1 refills | Status: AC
  Filled 2024-02-17: qty 90, 90d supply, fill #0

## 2024-02-17 MED ORDER — ACCU-CHEK GUIDE W/DEVICE KIT
1.0000 | PACK | 0 refills | Status: AC
  Filled 2024-02-17: qty 1, 90d supply, fill #0

## 2024-02-17 NOTE — Assessment & Plan Note (Signed)
 Continues cortisone otc.

## 2024-02-17 NOTE — Assessment & Plan Note (Signed)
 None of the SGLT2 inhibitors are covered by her insurance. Had hyperkalemia on lisinopril .  Will try valsartan  with close monitoring of potassium.

## 2024-02-17 NOTE — Progress Notes (Signed)
" ° °  Established Patient Office Visit  Subjective   Patient ID: Jade Gross, female    DOB: Jan 18, 1946  Age: 79 y.o. MRN: [id number]  Chief Complaint  Patient presents with   Hypertension    Here for follow up   Rash    Patient reports a rash on scalp   Hypertension  Rash   Patient in office today for 6 month blood pressure follow up and rash on her scalp. Patient reports compliance with current blood pressure regime. She reports recording her blood pressure 1x per week and all readings have been <140/90. BP in office today at goal. In addition, patient reports ongoing scalp rash/itching for the last couple of years. Takes hydrocortisone 10 with relief from itching, but states because she can not put it all over her head would like evaluation. She is concerned it may be psoriasis.   Review of Systems  Constitutional: Negative.   Eyes: Negative.        Reports appointment with Dr. Lita in April for eye exam  Respiratory: Negative.    Cardiovascular: Negative.   Skin:  Positive for itching and rash.     Objective:     BP 132/73 (BP Location: Right Arm, Patient Position: Sitting, Cuff Size: Normal)   Pulse 75   Temp 98.6 F (37 C) (Oral)   Resp 16   Ht 5' 2 (1.575 m)   Wt 165 lb (74.8 kg)   LMP 02/05/1996   SpO2 100%   BMI 30.18 kg/m  BP Readings from Last 3 Encounters:  02/17/24 132/73  12/31/23 (!) 142/82  10/14/23 (!) 142/67   Physical Exam Constitutional:      Appearance: Normal appearance.  HENT:     Head: Normocephalic.     Comments: Scaly rash noted at base of skull and over the left anterior scalp above the left eye  Cardiovascular:     Rate and Rhythm: Normal rate and regular rhythm.     Heart sounds: Normal heart sounds.  Pulmonary:     Effort: Pulmonary effort is normal.     Breath sounds: Normal breath sounds.  Skin:    Findings: Rash present.     Comments: See note for head  Neurological:     General: No focal deficit present.     Mental  Status: She is alert and oriented to person, place, and time. Mental status is at baseline.  Psychiatric:        Mood and Affect: Mood normal.      Assessment & Plan:   Type 2 diabetes -ordered glucometer and supplies patient instructed to check blood sugar several time a week -repeat A1C  Microalbuminuria  -discontinue hydrochlorothiazide  and potassium medicaion -start valsartan  80 mg per day  -CMP  Rash -referral placed for dermatology -continue hydrocortisone -discussed shampoo options   Hyperlipidemia -Lipid Panel   Return in 1 week for blood pressure and Bmet   Levon Budd, FNP student  "

## 2024-02-17 NOTE — Patient Instructions (Signed)
" °  VISIT SUMMARY: During today's visit, we discussed your blood pressure, scalp rash, diabetes management, and cholesterol levels. Your blood pressure readings have been good, and we made some changes to your medications. We also talked about your scalp rash and possible treatments, and we addressed your diabetes management and cholesterol levels.  YOUR PLAN: -HYPERTENSION AND CHRONIC KIDNEY DISEASE RISK: Your blood pressure is well controlled, but you are at risk for chronic kidney disease due to a protein in your urine. We have stopped your hydrochlorothiazide  and potassium and started you on valsartan  80 mg daily. We will check your potassium levels and blood pressure again in 1-2 weeks.  -TYPE 2 DIABETES MELLITUS: Managing your diabetes requires regular monitoring of your blood sugar levels. We have ordered tests to check your average blood sugar levels and kidney function. You have been provided with a glucometer, strips, and lancets for home monitoring.  -SCALP DERMATITIS (POSSIBLE PSORIASIS OR ECZEMA): You have a chronic scalp rash that may be psoriasis or eczema. We have referred you to a dermatologist for further evaluation. In the meantime, you can continue using hydrocortisone cream as needed.  -MIXED HYPERLIPIDEMIA: We need to assess your cholesterol levels, so we have ordered a lipid panel test.  INSTRUCTIONS: Please follow up in 1-2 weeks for a recheck of your potassium levels and blood pressure. Additionally, make sure to complete the A1c, microalbuminuria, and lipid panel tests as ordered. Continue using the hydrocortisone cream for your scalp as needed and await further instructions from the dermatologist.    Contains text generated by [employer].   "

## 2024-02-17 NOTE — Assessment & Plan Note (Signed)
 D/C hydrochlorothiazide , D/C Kdur.  Start Valsartan  for BP and renal protection with close monitoring of potassium.

## 2024-02-17 NOTE — Assessment & Plan Note (Signed)
 Lab Results  Component Value Date   CHOL 151 02/25/2023   HDL 47.80 02/25/2023   LDLCALC 85 02/25/2023   TRIG 93.0 02/25/2023   CHOLHDL 3 02/25/2023   Maintained on lipitor 40, update lipid panel.

## 2024-02-17 NOTE — Assessment & Plan Note (Addendum)
 Lab Results  Component Value Date   HGBA1C 6.9 (H) 10/14/2023   HGBA1C 6.7 (H) 06/24/2023   HGBA1C 6.8 (H) 02/25/2023   Lab Results  Component Value Date   MICROALBUR 2.7 (H) 06/24/2023   LDLCALC 85 02/25/2023   CREATININE 0.74 10/14/2023   Advised pt to check sugar several times a week. Continue DM diet.

## 2024-02-17 NOTE — Progress Notes (Signed)
 "  Subjective:     Patient ID: Jade Gross, female    DOB: [birth year], 79 y.o.   MRN: [id number]  Chief Complaint  Patient presents with   Hypertension    Here for follow up   Rash    Patient reports a rash on scalp    HPI  Discussed the use of AI scribe software for clinical note transcription with the patient, who gave verbal consent to proceed.  History of Present Illness Jade Gross is a 79 year old female with hypertension who presents for a blood pressure follow-up.  She checks her blood pressure once a week, with the most recent reading being 127/74 mmHg. All her readings have been below 140/90 mmHg. She is experiencing stress related to a friend's surgery.  She has a persistent scalp rash that has been present for over a year. The rash is flaky, dry, and cracking, with some areas bleeding due to scratching. She recalls being given a cream a few years ago but finds that 'cortisone ten works better' for her, although she cannot apply it to her entire scalp. She suspects it might be psoriasis.  She does not currently monitor her blood sugar at home and is reluctant to use a glucose meter due to discomfort with finger pricking. She is open to monitoring her blood sugar if necessary but prefers not to use devices that adhere to her skin.  She had a past issue with high potassium levels when on lisinopril , which led to its discontinuation. She is currently on hydrochlorothiazide  and potassium supplements, which she finds difficult to swallow.      Health Maintenance Due  Topic Date Due   Medicare Annual Wellness (AWV)  Never done   DTaP/Tdap/Td (3 - Td or Tdap) 03/04/2021   COVID-19 Vaccine (4 - 2025-26 season) 09/28/2023   OPHTHALMOLOGY EXAM  10/16/2023    Past Medical History:  Diagnosis Date   Borderline glaucoma    Eye problem    Hyperlipidemia    Hypertension     Past Surgical History:  Procedure Laterality Date   BUNIONECTOMY  ?2000   bunion removed from  both feet   cataract surger/lens implant  2000   right eye   Cataract surgery, lens implant and retinal repain  2005   left eye    Family History  Problem Relation Age of Onset   Heart disease Mother        Pacemaker at age 44   Hypertension Mother    Heart disease Father        died at 62 from MI, he had first heart attack at 53   Stroke Maternal Aunt    Heart attack Maternal Grandmother    Cancer Brother        lymphoma   Heart disease Other        died at 60 from heart attack (Brother's son)   Brain cancer Daughter     Social History   Socioeconomic History   Marital status: Divorced    Spouse name: Not on file   Number of children: 1   Years of education: Not on file   Highest education level: 12th grade  Occupational History    Employer: [employer]  Tobacco Use   Smoking status: Never   Smokeless tobacco: Never  Substance and Sexual Activity   Alcohol use: No   Drug use: No   Sexual activity: Not on file  Other Topics Concern   Not on file  Social History Narrative   Caffeine use:  <1 daily   Regular exercise:  No   Never smoked   Work at [employer] as a merchandiser, retail in audiological scientist   Divorced   She lives with roommate   Completed 12th grade         Social Drivers of Health   Tobacco Use: Low Risk (12/31/2023)   Patient History    Smoking Tobacco Use: Never    Smokeless Tobacco Use: Never    Passive Exposure: Not on file  Financial Resource Strain: Low Risk (12/31/2023)   Overall Financial Resource Strain (CARDIA)    Difficulty of Paying Living Expenses: Not hard at all  Food Insecurity: No Food Insecurity (12/31/2023)   Epic    Worried About Programme Researcher, Broadcasting/film/video in the Last Year: Never true    Ran Out of Food in the Last Year: Never true  Transportation Needs: No Transportation Needs (12/31/2023)   Epic    Lack of Transportation (Medical): No    Lack of Transportation (Non-Medical): No  Physical Activity: Insufficiently Active (12/31/2023)   Exercise  Vital Sign    Days of Exercise per Week: 3 days    Minutes of Exercise per Session: 20 min  Stress: No Stress Concern Present (12/31/2023)   [employer] of Occupational Health - Occupational Stress Questionnaire    Feeling of Stress: Not at all  Social Connections: Moderately Integrated (12/31/2023)   Social Connection and Isolation Panel    Frequency of Communication with Friends and Family: More than three times a week    Frequency of Social Gatherings with Friends and Family: Twice a week    Attends Religious Services: More than 4 times per year    Active Member of [employer] or Organizations: Yes    Attends Banker Meetings: More than 4 times per year    Marital Status: Divorced  Intimate Partner Violence: Not on file  Depression (PHQ2-9): Low Risk (02/17/2024)   Depression (PHQ2-9)    PHQ-2 Score: 0  Alcohol Screen: Not on file  Housing: Low Risk (12/31/2023)   Epic    Unable to Pay for Housing in the Last Year: No    Number of Times Moved in the Last Year: 0    Homeless in the Last Year: No  Utilities: Not on file  Health Literacy: Not on file    Outpatient Medications Prior to Visit  Medication Sig Dispense Refill   amLODipine  (NORVASC ) 10 MG tablet Take 1 tablet (10 mg total) by mouth daily. 90 tablet 1   aspirin 81 MG tablet Take 81 mg by mouth daily.     atorvastatin  (LIPITOR) 40 MG tablet Take 1 tablet (40 mg total) by mouth daily. 90 tablet 1   betamethasone  dipropionate 0.05 % cream Apply on to the skin 2 (two) times daily. 30 g 0   Calcium  Carbonate-Vitamin D  600-400 MG-UNIT tablet Take 1 tablet by mouth 2 (two) times daily.     cholecalciferol (VITAMIN D ) 1000 UNITS tablet Take 3,000 Units by mouth daily.     omeprazole  (PRILOSEC) 40 MG capsule Take 1 capsule (40 mg total) by mouth daily. 90 capsule 1   timolol  (TIMOPTIC ) 0.25 % ophthalmic solution Place 1 drop into both eyes every morning. 10 mL 3   vitamin C (ASCORBIC ACID) 500 MG tablet Take 500 mg by  mouth every morning.     amoxicillin -clavulanate (AUGMENTIN ) 500-125 MG tablet Take 1 tablet by mouth in the morning and at bedtime. 14  tablet 0   hydrochlorothiazide  (HYDRODIURIL ) 25 MG tablet Take 1 tablet (25 mg total) by mouth daily. 90 tablet 1   potassium chloride  SA (KLOR-CON  M) 20 MEQ tablet Take 1 tablet (20 mEq total) by mouth daily. 90 tablet 1   No facility-administered medications prior to visit.    Allergies[1]  ROS See HPI    Objective:    Physical Exam Constitutional:      General: She is not in acute distress.    Appearance: Normal appearance. She is well-developed.  HENT:     Head: Normocephalic and atraumatic.     Right Ear: External ear normal.     Left Ear: External ear normal.  Eyes:     General: No scleral icterus. Neck:     Thyroid: No thyromegaly.  Cardiovascular:     Rate and Rhythm: Normal rate and regular rhythm.     Heart sounds: Normal heart sounds. No murmur heard. Pulmonary:     Effort: Pulmonary effort is normal. No respiratory distress.     Breath sounds: Normal breath sounds. No wheezing.  Musculoskeletal:     Cervical back: Neck supple.  Skin:    General: Skin is warm and dry.     Comments: Scaly rash noted occipital scalp and right temporal scalp.   Neurological:     Mental Status: She is alert and oriented to person, place, and time.  Psychiatric:        Mood and Affect: Mood normal.        Behavior: Behavior normal.        Thought Content: Thought content normal.        Judgment: Judgment normal.      BP 132/73 (BP Location: Right Arm, Patient Position: Sitting, Cuff Size: Normal)   Pulse 75   Temp 98.6 F (37 C) (Oral)   Resp 16   Ht 5' 2 (1.575 m)   Wt 165 lb (74.8 kg)   LMP 02/05/1996   SpO2 100%   BMI 30.18 kg/m  Wt Readings from Last 3 Encounters:  02/17/24 165 lb (74.8 kg)  12/31/23 167 lb (75.8 kg)  10/14/23 167 lb (75.8 kg)       Assessment & Plan:   Problem List Items Addressed This Visit        Unprioritized   Microalbuminuria   None of the SGLT2 inhibitors are covered by her insurance. Had hyperkalemia on lisinopril .  Will try valsartan  with close monitoring of potassium.       Relevant Medications   valsartan  (DIOVAN ) 80 MG tablet   HYPERTENSION, BENIGN SYSTEMIC   D/C hydrochlorothiazide , D/C Kdur.  Start Valsartan  for BP and renal protection with close monitoring of potassium.       Relevant Medications   valsartan  (DIOVAN ) 80 MG tablet   Hyperlipidemia   Lab Results  Component Value Date   CHOL 151 02/25/2023   HDL 47.80 02/25/2023   LDLCALC 85 02/25/2023   TRIG 93.0 02/25/2023   CHOLHDL 3 02/25/2023   Maintained on lipitor 40, update lipid panel.      Relevant Medications   valsartan  (DIOVAN ) 80 MG tablet   Other Relevant Orders   Lipid panel   Eczema   Continues cortisone otc.        Diabetes type 2, controlled (HCC) - Primary   Lab Results  Component Value Date   HGBA1C 6.9 (H) 10/14/2023   HGBA1C 6.7 (H) 06/24/2023   HGBA1C 6.8 (H) 02/25/2023   Lab Results  Component  Value Date   MICROALBUR 2.7 (H) 06/24/2023   LDLCALC 85 02/25/2023   CREATININE 0.74 10/14/2023   Advised pt to check sugar several times a week. Continue DM diet.      Relevant Medications   Blood Glucose Monitoring Suppl (ACCU-CHEK GUIDE) w/Device KIT   Glucose Blood (BLOOD GLUCOSE TEST STRIPS) STRP   Lancet Device MISC   Lancets MISC   valsartan  (DIOVAN ) 80 MG tablet   Other Relevant Orders   HgB A1c   Comp Met (CMET)   Other Visit Diagnoses       Rash       Relevant Orders   Ambulatory referral to Dermatology       I have discontinued Jade FALCON. Lappe's potassium chloride  SA, amoxicillin -clavulanate, and hydrochlorothiazide . I am also having her start on Accu-Chek Guide, BLOOD GLUCOSE TEST STRIPS, Lancet Device, Lancets, and valsartan . Additionally, I am having her maintain her cholecalciferol, Calcium  Carbonate-Vitamin D , aspirin, ascorbic acid, betamethasone   dipropionate, timolol , omeprazole , atorvastatin , and amLODipine .  Meds ordered this encounter  Medications   Blood Glucose Monitoring Suppl (ACCU-CHEK GUIDE) w/Device KIT    Sig: Use up to four times daily as directed.    Dispense:  1 kit    Refill:  0    1 each by Does not apply route as directed. Dispense based on patient and insurance preference. Use up to four times daily as directed. (FOR ICD-10 E10.9, E11.9).    Supervising Provider:   DOMENICA BLACKBIRD A [[id number]]   Glucose Blood (BLOOD GLUCOSE TEST STRIPS) STRP    Sig: Use up to four times daily as directed.    Dispense:  100 strip    Refill:  0    1 each by Does not apply route as directed. Dispense based on patient and insurance preference. Use up to four times daily as directed. (FOR ICD-10 E10.9, E11.9).    Supervising Provider:   DOMENICA BLACKBIRD LABOR [phone]   Lancet Device MISC    Sig: 1 each by Does not apply route as directed. Dispense based on patient and insurance preference. Use up to four times daily as directed. (FOR ICD-10 E10.9, E11.9).    Dispense:  1 each    Refill:  0    Supervising Provider:   DOMENICA BLACKBIRD A [[id number]]   Lancets MISC    Sig: 1 each by Does not apply route as directed. Dispense based on patient and insurance preference. Use up to four times daily as directed. (FOR ICD-10 E10.9, E11.9).    Dispense:  100 each    Refill:  0    Supervising Provider:   DOMENICA BLACKBIRD A [[id number]]   valsartan  (DIOVAN ) 80 MG tablet    Sig: Take 1 tablet (80 mg total) by mouth daily.    Dispense:  90 tablet    Refill:  1    Supervising Provider:   DOMENICA BLACKBIRD A [[id number]]      [1]  Allergies Allergen Reactions   Lisinopril      Hyperkalemia    "

## 2024-03-02 ENCOUNTER — Ambulatory Visit

## 2024-03-02 ENCOUNTER — Other Ambulatory Visit (HOSPITAL_BASED_OUTPATIENT_CLINIC_OR_DEPARTMENT_OTHER): Payer: Self-pay

## 2024-03-02 ENCOUNTER — Other Ambulatory Visit: Payer: Self-pay

## 2024-03-02 ENCOUNTER — Other Ambulatory Visit

## 2024-03-02 VITALS — BP 120/70 | HR 72

## 2024-03-02 DIAGNOSIS — I10 Essential (primary) hypertension: Secondary | ICD-10-CM

## 2024-03-02 LAB — BASIC METABOLIC PANEL WITH GFR
BUN: 21 mg/dL (ref 6–23)
CO2: 28 meq/L (ref 19–32)
Calcium: 9.1 mg/dL (ref 8.4–10.5)
Chloride: 106 meq/L (ref 96–112)
Creatinine, Ser: 0.61 mg/dL (ref 0.40–1.20)
GFR: 85.86 mL/min
Glucose, Bld: 98 mg/dL (ref 70–99)
Potassium: 3.9 meq/L (ref 3.5–5.1)
Sodium: 143 meq/L (ref 135–145)

## 2024-03-02 NOTE — Progress Notes (Signed)
 Pt here for Blood pressure check per Melissa  Pt currently takes: Amlodipine  10mg ; once daily    Valsartan  80mg ; once daily   Pt reports compliance with medication.  BP today @ = 120/70 HR = 72  Pt advised per Melissa no changes at this time. Monitor blood pressures at home. Have BMP done today and follow up in 3 months. Pt scheduled.

## 2024-03-03 ENCOUNTER — Ambulatory Visit: Payer: Self-pay | Admitting: Family

## 2024-06-08 ENCOUNTER — Ambulatory Visit: Admitting: Family
